# Patient Record
Sex: Female | Born: 1980 | Hispanic: No | Marital: Single | Smoking: Never smoker
Health system: Southern US, Community
[De-identification: ages and names within clinical notes are randomized; demographics above are authoritative.]

## PROBLEM LIST (undated history)

## (undated) DIAGNOSIS — Z6841 Body Mass Index (BMI) 40.0 and over, adult: Secondary | ICD-10-CM

## (undated) DIAGNOSIS — Z789 Other specified health status: Secondary | ICD-10-CM

## (undated) HISTORY — DX: Body Mass Index (BMI) 40.0 and over, adult: Z684

## (undated) HISTORY — DX: Other specified health status: Z78.9

---

## 2006-10-02 ENCOUNTER — Other Ambulatory Visit: Admission: RE | Admit: 2006-10-02 | Discharge: 2006-10-02 | Payer: Self-pay | Admitting: Gynecology

## 2021-10-07 LAB — OB RESULTS CONSOLE ANTIBODY SCREEN: Antibody Screen: NEGATIVE

## 2021-10-07 LAB — OB RESULTS CONSOLE GC/CHLAMYDIA
Chlamydia: POSITIVE
Gonorrhea: NEGATIVE

## 2021-10-07 LAB — HEPATITIS C ANTIBODY: HCV Ab: NEGATIVE

## 2021-10-07 LAB — OB RESULTS CONSOLE ABO/RH: RH Type: POSITIVE

## 2021-10-07 LAB — OB RESULTS CONSOLE RPR: RPR: NONREACTIVE

## 2021-10-07 LAB — OB RESULTS CONSOLE VARICELLA ZOSTER ANTIBODY, IGG: Varicella: IMMUNE

## 2021-10-07 LAB — OB RESULTS CONSOLE RUBELLA ANTIBODY, IGM: Rubella: IMMUNE

## 2021-10-07 LAB — OB RESULTS CONSOLE HIV ANTIBODY (ROUTINE TESTING): HIV: NONREACTIVE

## 2021-10-07 LAB — OB RESULTS CONSOLE HEPATITIS B SURFACE ANTIGEN: Hepatitis B Surface Ag: NEGATIVE

## 2021-10-12 ENCOUNTER — Other Ambulatory Visit: Payer: Self-pay | Admitting: Family Medicine

## 2021-10-12 DIAGNOSIS — Z363 Encounter for antenatal screening for malformations: Secondary | ICD-10-CM

## 2021-10-13 ENCOUNTER — Ambulatory Visit: Payer: Self-pay | Admitting: *Deleted

## 2021-10-13 ENCOUNTER — Ambulatory Visit: Payer: Self-pay | Attending: Family Medicine

## 2021-10-13 ENCOUNTER — Other Ambulatory Visit: Payer: Self-pay

## 2021-10-13 ENCOUNTER — Ambulatory Visit (HOSPITAL_BASED_OUTPATIENT_CLINIC_OR_DEPARTMENT_OTHER): Payer: Self-pay

## 2021-10-13 ENCOUNTER — Encounter: Payer: Self-pay | Admitting: *Deleted

## 2021-10-13 VITALS — BP 109/53 | HR 71 | Ht 60.0 in

## 2021-10-13 DIAGNOSIS — Z363 Encounter for antenatal screening for malformations: Secondary | ICD-10-CM | POA: Insufficient documentation

## 2021-10-13 DIAGNOSIS — O09522 Supervision of elderly multigravida, second trimester: Secondary | ICD-10-CM

## 2021-10-13 NOTE — Progress Notes (Signed)
Name: Dolores Frame Indication: Advanced Maternal Age  DOB: 1981-02-17 Age: 40 y.o.   EDC: 01/16/2022 LMP: 04/11/2021 Referring Provider:  Scot Jun, FNP  EGA: [redacted]w[redacted]d Genetic Counselor: Staci Righter, [place], [place]  OB HxRN:[id number] Date of Appointment: 10/13/2021  Accompanied by: Spanish Interpreter ID [id number] Face to Face Time: 30 Minutes   Previous Testing Completed: CBC from 10/07/2021 reviewed. MCV within normal limits. It is unlikely that Eslie is a beta thalassemia carrier or an alpha thalassemia carrier of the double-gene deletion. Individuals with a normal MCV may be single-gene deletion carriers, but it is unlikely that the current pregnancy would be affected with alpha or beta thalassemia major.   Medical History:  This is Everlie's 5th pregnancy. She has 3 living children. She has had one early loss. Reports she takes prenatal vitamins. Denies personal history of diabetes, high blood pressure, thyroid conditions, and seizures. Denies bleeding, infections, and fevers in this pregnancy. Denies using tobacco, alcohol, or street drugs in this pregnancy.   Family History: A pedigree was created and scanned into Epic under the Media tab. Camala reports one of her sons was born with a cleft lip without cleft palate. He is otherwise healthy and has no other birth defects.  Maternal ethnicity reported as Hispanic and paternal ethnicity reported as Hispanic. Denies Ashkenazi Jewish ancestry. Family history not remarkable for consanguinity, intellectual disability, autism spectrum disorder, multiple spontaneous abortions, still births, or unexplained neonatal death.     Genetic Counseling:   Advanced Maternal Age. With delivery at 10 years, Charniece's age-related risk to have a pregnancy with Down syndrome is 1/99 and risk to have a pregnancy with any chromosome condition is 1/56. Information about the most common chromosome conditions such as Down syndrome, Trisomy 81, Trisomy 75,  and common sex chromosome aneuploidies was described, as well as specifics about screening vs. diagnostic testing in pregnancy.  Previous Child with Cleft Lip. Cleft lip, with or without cleft palate, results when the lip and/or palate fails to close early in the first trimester of pregnancy. Majority of cleft lips also have cleft palate; however, due to the limitations of ultrasound examination, it is difficult to detect cleft palate prenatally. The vast majority of cleft lip, with or without cleft palate, are isolated occurrences within a family. Some cases may be part of a larger syndrome, either chromosomal or single-gene, or could be secondary to a maternal teratogen exposure such as a medication or infection. Taking into consideration that Shaquitta's son's cleft lip is apparently isolated, the recurrence risk for [facility] to have another child with a cleft lip, with or without cleft palate, is thought to be about 4%.  Birth Defects. All babies have approximately a 3-5% risk for a birth defect and a majority of these defects cannot be detected through the screening or diagnostic testing listed below. Ultrasound may detect some birth defects, but it may not detect all birth defects. About half of pregnancies with Down syndrome do not show any soft markers on ultrasound. A normal ultrasound does not guarantee a healthy pregnancy.   Testing/Screening Options:   Amniocentesis. This procedure is available for prenatal diagnosis. Possible procedural difficulties and complications that can arise include maternal infection, cramping, bleeding, fluid leakage, and/or pregnancy loss. The risk for pregnancy loss with an amniocentesis is 1/500. Per the [employer] of [employer]stetricians and Gynecologists (ACOG) Practice Bulletin 162, all pregnant women should be offered prenatal assessment for aneuploidy by diagnostic testing regardless of maternal age or other risk factors.  If indicated, genetic testing that could be  ordered on an amniocentesis sample includes a fetal karyotype, fetal microarray, and testing for specific syndromes.  Non-invasive prenatal screening (NIPS). This can screen the pregnancy for aneuploidy involving chromosomes 13, 18, 21, X, and Y. If NIPS results indicate high-risk for a chromosomal aneuploidy, prenatal diagnosis via CVS or amniocentesis would be recommended. A low-risk NIPS result does not ensure an unaffected pregnancy. NIPS does not screen for neural tube defects or other genetic conditions. Per the ACOG Practice Bulletin 226 all pregnant women regardless of age, baseline risk, and number of fetuses should be offered all screening options including NIPS which was previously only offered for singleton high risk pregnancies.  Carrier screening. Per the ACOG Committee Opinion 691, all women who are considering a pregnancy or are currently pregnant should be offered carrier screening for, at minimum, Cystic Fibrosis (CF), Spinal Muscular Atrophy (SMA), and Hemoglobinopathies. A negative result on carrier screening reduces the likelihood of being a carrier, however, does not entirely rule out the possibility. If Odester was found to be a carrier for a specific condition, carrier screening for their reproductive partner would be recommended.    Patient Plan:  Proceed with: NIPS, Carrier screening for CF, SMA, alpha thalassemia, and beta hemoglobinopathies currently pending at the time of her genetic counseling appointment Informed consent was obtained. All questions were answered.  Declined: Amniocentesis   Thank you for sharing in the care of [place] with [place].  Please do not hesitate to contact us if you have any questions.  Teena Dunk, MS, [facility]

## 2021-10-14 ENCOUNTER — Other Ambulatory Visit: Payer: Self-pay | Admitting: *Deleted

## 2021-10-14 DIAGNOSIS — O0932 Supervision of pregnancy with insufficient antenatal care, second trimester: Secondary | ICD-10-CM

## 2021-10-14 DIAGNOSIS — O99212 Obesity complicating pregnancy, second trimester: Secondary | ICD-10-CM

## 2021-10-14 DIAGNOSIS — O34219 Maternal care for unspecified type scar from previous cesarean delivery: Secondary | ICD-10-CM

## 2021-10-14 DIAGNOSIS — O09522 Supervision of elderly multigravida, second trimester: Secondary | ICD-10-CM

## 2021-10-18 ENCOUNTER — Telehealth: Payer: Self-pay | Admitting: Genetics

## 2021-10-18 NOTE — Telephone Encounter (Signed)
Called [facility] to return low risk NIPS results. Left voicemail with [facility] call back number.

## 2021-10-19 ENCOUNTER — Ambulatory Visit: Payer: Self-pay

## 2021-10-20 ENCOUNTER — Telehealth: Payer: Self-pay | Admitting: Genetics

## 2021-10-20 NOTE — Telephone Encounter (Signed)
Called [facility] to return NIPS and carrier screening results. Left voicemail with [facility] call back number.

## 2021-11-05 ENCOUNTER — Other Ambulatory Visit: Payer: Self-pay

## 2021-11-10 ENCOUNTER — Ambulatory Visit: Payer: Self-pay | Attending: Obstetrics

## 2021-11-10 ENCOUNTER — Other Ambulatory Visit: Payer: Self-pay

## 2021-11-10 ENCOUNTER — Other Ambulatory Visit: Payer: Self-pay | Admitting: *Deleted

## 2021-11-10 ENCOUNTER — Ambulatory Visit: Payer: Self-pay | Admitting: *Deleted

## 2021-11-10 VITALS — BP 99/46 | HR 71

## 2021-11-10 DIAGNOSIS — Z6841 Body Mass Index (BMI) 40.0 and over, adult: Secondary | ICD-10-CM

## 2021-11-10 DIAGNOSIS — O99212 Obesity complicating pregnancy, second trimester: Secondary | ICD-10-CM | POA: Insufficient documentation

## 2021-11-10 DIAGNOSIS — E669 Obesity, unspecified: Secondary | ICD-10-CM

## 2021-11-10 DIAGNOSIS — O09523 Supervision of elderly multigravida, third trimester: Secondary | ICD-10-CM

## 2021-11-10 DIAGNOSIS — O0932 Supervision of pregnancy with insufficient antenatal care, second trimester: Secondary | ICD-10-CM | POA: Insufficient documentation

## 2021-11-10 DIAGNOSIS — Z3A30 30 weeks gestation of pregnancy: Secondary | ICD-10-CM

## 2021-11-10 DIAGNOSIS — O34219 Maternal care for unspecified type scar from previous cesarean delivery: Secondary | ICD-10-CM | POA: Insufficient documentation

## 2021-11-10 DIAGNOSIS — O09522 Supervision of elderly multigravida, second trimester: Secondary | ICD-10-CM | POA: Insufficient documentation

## 2021-11-10 LAB — OB RESULTS CONSOLE GC/CHLAMYDIA: Chlamydia: NEGATIVE

## 2021-12-08 ENCOUNTER — Other Ambulatory Visit: Payer: Self-pay | Admitting: *Deleted

## 2021-12-08 ENCOUNTER — Encounter: Payer: Self-pay | Admitting: *Deleted

## 2021-12-08 ENCOUNTER — Other Ambulatory Visit: Payer: Self-pay

## 2021-12-08 ENCOUNTER — Ambulatory Visit: Payer: Self-pay | Admitting: *Deleted

## 2021-12-08 ENCOUNTER — Ambulatory Visit: Payer: Self-pay | Attending: Obstetrics

## 2021-12-08 VITALS — BP 106/59 | HR 70

## 2021-12-08 DIAGNOSIS — O09523 Supervision of elderly multigravida, third trimester: Secondary | ICD-10-CM

## 2021-12-08 DIAGNOSIS — O34219 Maternal care for unspecified type scar from previous cesarean delivery: Secondary | ICD-10-CM

## 2021-12-08 DIAGNOSIS — Z3A34 34 weeks gestation of pregnancy: Secondary | ICD-10-CM

## 2021-12-08 DIAGNOSIS — O409XX0 Polyhydramnios, unspecified trimester, not applicable or unspecified: Secondary | ICD-10-CM

## 2021-12-08 DIAGNOSIS — Z6841 Body Mass Index (BMI) 40.0 and over, adult: Secondary | ICD-10-CM | POA: Insufficient documentation

## 2021-12-08 DIAGNOSIS — O403XX0 Polyhydramnios, third trimester, not applicable or unspecified: Secondary | ICD-10-CM

## 2021-12-15 ENCOUNTER — Ambulatory Visit: Payer: Self-pay | Admitting: *Deleted

## 2021-12-15 ENCOUNTER — Encounter: Payer: Self-pay | Admitting: *Deleted

## 2021-12-15 ENCOUNTER — Other Ambulatory Visit: Payer: Self-pay

## 2021-12-15 ENCOUNTER — Ambulatory Visit: Payer: Self-pay | Attending: Obstetrics and Gynecology

## 2021-12-15 VITALS — BP 106/41 | HR 65

## 2021-12-15 DIAGNOSIS — O403XX0 Polyhydramnios, third trimester, not applicable or unspecified: Secondary | ICD-10-CM

## 2021-12-15 DIAGNOSIS — O09213 Supervision of pregnancy with history of pre-term labor, third trimester: Secondary | ICD-10-CM

## 2021-12-15 DIAGNOSIS — E669 Obesity, unspecified: Secondary | ICD-10-CM

## 2021-12-15 DIAGNOSIS — Z6841 Body Mass Index (BMI) 40.0 and over, adult: Secondary | ICD-10-CM | POA: Insufficient documentation

## 2021-12-15 DIAGNOSIS — Z3A35 35 weeks gestation of pregnancy: Secondary | ICD-10-CM

## 2021-12-15 DIAGNOSIS — O09523 Supervision of elderly multigravida, third trimester: Secondary | ICD-10-CM

## 2021-12-17 ENCOUNTER — Other Ambulatory Visit: Payer: Self-pay | Admitting: Family Medicine

## 2021-12-17 DIAGNOSIS — Z98891 History of uterine scar from previous surgery: Secondary | ICD-10-CM

## 2021-12-22 ENCOUNTER — Ambulatory Visit: Payer: Self-pay | Attending: Obstetrics and Gynecology

## 2021-12-22 ENCOUNTER — Other Ambulatory Visit: Payer: Self-pay | Admitting: *Deleted

## 2021-12-22 ENCOUNTER — Ambulatory Visit: Payer: Self-pay | Admitting: *Deleted

## 2021-12-22 ENCOUNTER — Other Ambulatory Visit: Payer: Self-pay

## 2021-12-22 VITALS — BP 96/48 | HR 71

## 2021-12-22 DIAGNOSIS — Z6841 Body Mass Index (BMI) 40.0 and over, adult: Secondary | ICD-10-CM

## 2021-12-22 DIAGNOSIS — O09523 Supervision of elderly multigravida, third trimester: Secondary | ICD-10-CM

## 2021-12-22 DIAGNOSIS — O34219 Maternal care for unspecified type scar from previous cesarean delivery: Secondary | ICD-10-CM

## 2021-12-22 DIAGNOSIS — Z3A36 36 weeks gestation of pregnancy: Secondary | ICD-10-CM

## 2021-12-22 DIAGNOSIS — O99213 Obesity complicating pregnancy, third trimester: Secondary | ICD-10-CM

## 2021-12-22 DIAGNOSIS — E669 Obesity, unspecified: Secondary | ICD-10-CM

## 2021-12-22 LAB — OB RESULTS CONSOLE GBS: GBS: NEGATIVE

## 2021-12-22 LAB — OB RESULTS CONSOLE GC/CHLAMYDIA: Gonorrhea: NEGATIVE

## 2021-12-28 NOTE — Patient Instructions (Addendum)
[facility] ? 12/28/2021 ? ? Your procedure is scheduled on:  01/11/2022 ? Arrive at [employer] at [employer] on [address] at [facility]  and [address]. You are invited to use the FREE valet parking or use the Visitor's parking deck. ? Pick up the phone at the desk and dial [phone]. ? Call this number if you have problems the morning of surgery: [phone] ? Remember: ? ? Do not eat food:(After Midnight) Desp?s de medianoche. ? Do not drink clear liquids: (After Midnight) Desp?s de medianoche. ? Take these medicines the morning of surgery with A SIP OF WATER:  none ? ? Do not wear jewelry, make-up or nail polish. ? Do not wear lotions, powders, or perfumes. Do not wear deodorant. ? Do not shave 48 hours prior to surgery. ? Do not bring valuables to the hospital.  [facility] is not  ? responsible for any belongings or valuables brought to the hospital. ? Contacts, dentures or bridgework may not be worn into surgery. ? Leave suitcase in the car. After surgery it may be brought to your room. ? For patients admitted to the hospital, checkout time is 11:00 AM the day of  ?            discharge. ? ?   ? Please read over the following fact sheets that you were given:  ?   Preparing for Surgery ? ?                                        Instrucciones Para Antes de la Cirug?a ?  ?Su cirug?a est? programada para 01/11/2022  (your procedure is scheduled on) Entre por la entrada principal del [facility]  a las 0645 de la ma?ana -(enter through the main entrance at [facility] at 0645 AM  ?  ?[address] tel?fono, [place] [phone] para informarnos de su llegada. (pick up phone, dial [phone] on arrival)   ?  ?Por favor llame al [phone] si tiene alg?n problema en la ma?ana de la cirug?a (please call this number if you have any problems the morning of surgery.) ?                 ?Recuerde: (Remember)  ?No coma alimentos. ?(Do not eat food (After Midnight) Desp?s de medianoche)  ?  ?No  tome l?quidos claros. ?(Do not drink clear liquids (After Midnight) Desp?s de medianoche)  ?  ?No use joyas, maquillaje de ojos, l?piz labial, crema para el cuerpo o esmalte de u?as oscuro. (Do not wear jewelry, eye makeup, lipstick, body lotion, or dark fingernail polish). Puede usar desodorante (you may wear deodorant)  ?  ?No se afeite 48 horas de su cirug?a. ?(Do not shave 48 hours before your surgery)  ?  ?No traiga objetos de valor al hospital.  [facility] no se hace responsable de ninguna pertenencia, ni objetos de valor que haya tra?do al hospital. ?(Do not bring valuable to the hospital.  [facility] is not responsible for any belongings or valuables brought to the hospital) ?  ?Tome estas medicinas en la ma?ana de la cirug?a con un SORBITO de agua nada (take these meds the morning of surgery with a SIP of water)   ?  ?Durante la cirug?a no se pueden usar lentes de contacto, dentaduras o puentes. ?(Contacts, dentures or bridgework cannot be worn in surgery). ?  ?  Si va a ser ingresado despu?s de la cirug?a, deje la maleta en el carro hasta que se le haya asignado una habitaci?n. (If you are to be admitted after surgery, leave suitcase in car until your room has been assigned.) ?  ?A los pacientes que se les d? de alta el mismo d?a no se les permitir? manejar a casa.  ?(Patients discharged on the day of surgery will not be allowed to drive home) ?  ? ?Nombre y n?mero de tel?fono del Engineer, production. ?(Name and telephone number of your driver) ?  ?Instrucciones especiales ?Shower using CHG 2 nights before surgery and the night before surgery.  If you shower the day of surgery use CHG.  Use special wash - you have one bottle of CHG for all showers.  You should use approximately 1/3 of the bottle for each shower. ?(Special Instructions) ?  ?Por favor, lea las hojas informativas que le entregaron. ?(Please read over the following fact sheets that you were given) ?Surgical Site Infection Prevention ? ?

## 2021-12-30 ENCOUNTER — Encounter: Payer: Self-pay | Admitting: *Deleted

## 2021-12-30 ENCOUNTER — Ambulatory Visit: Payer: Self-pay | Attending: Obstetrics and Gynecology

## 2021-12-30 ENCOUNTER — Other Ambulatory Visit: Payer: Self-pay

## 2021-12-30 ENCOUNTER — Ambulatory Visit: Payer: Self-pay | Admitting: *Deleted

## 2021-12-30 VITALS — BP 106/59 | HR 78

## 2021-12-30 DIAGNOSIS — Z6841 Body Mass Index (BMI) 40.0 and over, adult: Secondary | ICD-10-CM | POA: Insufficient documentation

## 2021-12-30 DIAGNOSIS — O403XX0 Polyhydramnios, third trimester, not applicable or unspecified: Secondary | ICD-10-CM

## 2021-12-30 DIAGNOSIS — O34219 Maternal care for unspecified type scar from previous cesarean delivery: Secondary | ICD-10-CM

## 2021-12-30 DIAGNOSIS — O0933 Supervision of pregnancy with insufficient antenatal care, third trimester: Secondary | ICD-10-CM

## 2021-12-30 DIAGNOSIS — O409XX0 Polyhydramnios, unspecified trimester, not applicable or unspecified: Secondary | ICD-10-CM | POA: Insufficient documentation

## 2021-12-30 DIAGNOSIS — O09523 Supervision of elderly multigravida, third trimester: Secondary | ICD-10-CM

## 2021-12-30 DIAGNOSIS — Z3A37 37 weeks gestation of pregnancy: Secondary | ICD-10-CM

## 2021-12-31 NOTE — Pre-Procedure Instructions (Signed)
Preadmission screen ?Interpreter number [phone] ?

## 2021-12-31 NOTE — Pre-Procedure Instructions (Signed)
Message left

## 2022-01-03 ENCOUNTER — Telehealth (HOSPITAL_COMMUNITY): Payer: Self-pay | Admitting: *Deleted

## 2022-01-03 NOTE — Telephone Encounter (Signed)
Preadmission screen  

## 2022-01-04 NOTE — Pre-Procedure Instructions (Addendum)
Interpreter number [phone] ?Message left ?

## 2022-01-05 ENCOUNTER — Encounter: Payer: Self-pay | Admitting: *Deleted

## 2022-01-05 ENCOUNTER — Ambulatory Visit: Payer: Medicaid Other | Attending: Obstetrics and Gynecology

## 2022-01-05 ENCOUNTER — Ambulatory Visit: Payer: Self-pay | Admitting: *Deleted

## 2022-01-05 ENCOUNTER — Other Ambulatory Visit: Payer: Self-pay

## 2022-01-05 VITALS — BP 108/62 | HR 80

## 2022-01-05 DIAGNOSIS — Z6841 Body Mass Index (BMI) 40.0 and over, adult: Secondary | ICD-10-CM | POA: Insufficient documentation

## 2022-01-05 DIAGNOSIS — O99213 Obesity complicating pregnancy, third trimester: Secondary | ICD-10-CM | POA: Diagnosis not present

## 2022-01-05 DIAGNOSIS — O0933 Supervision of pregnancy with insufficient antenatal care, third trimester: Secondary | ICD-10-CM

## 2022-01-05 DIAGNOSIS — O34219 Maternal care for unspecified type scar from previous cesarean delivery: Secondary | ICD-10-CM | POA: Diagnosis not present

## 2022-01-05 DIAGNOSIS — Z3A38 38 weeks gestation of pregnancy: Secondary | ICD-10-CM

## 2022-01-05 DIAGNOSIS — O09523 Supervision of elderly multigravida, third trimester: Secondary | ICD-10-CM | POA: Insufficient documentation

## 2022-01-05 DIAGNOSIS — O403XX0 Polyhydramnios, third trimester, not applicable or unspecified: Secondary | ICD-10-CM

## 2022-01-05 DIAGNOSIS — O409XX0 Polyhydramnios, unspecified trimester, not applicable or unspecified: Secondary | ICD-10-CM

## 2022-01-05 DIAGNOSIS — Z8279 Family history of other congenital malformations, deformations and chromosomal abnormalities: Secondary | ICD-10-CM

## 2022-01-06 ENCOUNTER — Telehealth (HOSPITAL_COMMUNITY): Payer: Self-pay | Admitting: *Deleted

## 2022-01-06 ENCOUNTER — Encounter (HOSPITAL_COMMUNITY): Payer: Self-pay

## 2022-01-06 NOTE — Telephone Encounter (Signed)
Preadmission screen ?Interpreter number [phone] ?

## 2022-01-10 ENCOUNTER — Other Ambulatory Visit: Payer: Self-pay

## 2022-01-10 ENCOUNTER — Other Ambulatory Visit (HOSPITAL_COMMUNITY)
Admission: RE | Admit: 2022-01-10 | Discharge: 2022-01-10 | Disposition: A | Discharge status: Home / Self Care | Payer: Self-pay | Source: Ambulatory Visit | Attending: Obstetrics and Gynecology | Admitting: Obstetrics and Gynecology

## 2022-01-10 DIAGNOSIS — Z98891 History of uterine scar from previous surgery: Secondary | ICD-10-CM

## 2022-01-10 DIAGNOSIS — Z01812 Encounter for preprocedural laboratory examination: Secondary | ICD-10-CM | POA: Insufficient documentation

## 2022-01-10 LAB — CBC
HCT: 35.1 % — ABNORMAL LOW (ref 36.0–46.0)
Hemoglobin: 11.7 g/dL — ABNORMAL LOW (ref 12.0–15.0)
MCH: 30.4 pg (ref 26.0–34.0)
MCHC: 33.3 g/dL (ref 30.0–36.0)
MCV: 91.2 fL (ref 80.0–100.0)
Platelets: 163 10*3/uL (ref 150–400)
RBC: 3.85 MIL/uL — ABNORMAL LOW (ref 3.87–5.11)
RDW: 14.1 % (ref 11.5–15.5)
WBC: 9.3 10*3/uL (ref 4.0–10.5)
nRBC: 0 % (ref 0.0–0.2)

## 2022-01-10 LAB — TYPE AND SCREEN
ABO/RH(D): O POS
Antibody Screen: NEGATIVE

## 2022-01-10 LAB — RAPID HIV SCREEN (HIV 1/2 AB+AG)
HIV 1/2 Antibodies: NONREACTIVE
HIV-1 P24 Antigen - HIV24: NONREACTIVE

## 2022-01-11 ENCOUNTER — Inpatient Hospital Stay (HOSPITAL_COMMUNITY)
Admission: RE | Admit: 2022-01-11 | Discharge: 2022-01-13 | DRG: 785 | Disposition: A | Discharge status: Home / Self Care | Payer: Medicaid Other | Attending: Obstetrics and Gynecology | Admitting: Obstetrics and Gynecology

## 2022-01-11 ENCOUNTER — Other Ambulatory Visit: Payer: Self-pay

## 2022-01-11 ENCOUNTER — Inpatient Hospital Stay (HOSPITAL_COMMUNITY): Payer: Medicaid Other | Admitting: Anesthesiology

## 2022-01-11 ENCOUNTER — Encounter (HOSPITAL_COMMUNITY)
Admission: RE | Disposition: A | Discharge status: Home / Self Care | Payer: Self-pay | Source: Home / Self Care | Attending: Obstetrics and Gynecology

## 2022-01-11 ENCOUNTER — Encounter (HOSPITAL_COMMUNITY): Payer: Self-pay | Admitting: Obstetrics and Gynecology

## 2022-01-11 DIAGNOSIS — Z3A39 39 weeks gestation of pregnancy: Secondary | ICD-10-CM

## 2022-01-11 DIAGNOSIS — Z789 Other specified health status: Secondary | ICD-10-CM | POA: Diagnosis present

## 2022-01-11 DIAGNOSIS — Z302 Encounter for sterilization: Secondary | ICD-10-CM | POA: Diagnosis not present

## 2022-01-11 DIAGNOSIS — O99214 Obesity complicating childbirth: Secondary | ICD-10-CM | POA: Diagnosis present

## 2022-01-11 DIAGNOSIS — Z6841 Body Mass Index (BMI) 40.0 and over, adult: Secondary | ICD-10-CM

## 2022-01-11 DIAGNOSIS — O34219 Maternal care for unspecified type scar from previous cesarean delivery: Secondary | ICD-10-CM | POA: Diagnosis not present

## 2022-01-11 DIAGNOSIS — Z20822 Contact with and (suspected) exposure to covid-19: Secondary | ICD-10-CM | POA: Diagnosis present

## 2022-01-11 DIAGNOSIS — O09523 Supervision of elderly multigravida, third trimester: Secondary | ICD-10-CM | POA: Diagnosis present

## 2022-01-11 DIAGNOSIS — O98811 Other maternal infectious and parasitic diseases complicating pregnancy, first trimester: Secondary | ICD-10-CM | POA: Diagnosis not present

## 2022-01-11 DIAGNOSIS — O99210 Obesity complicating pregnancy, unspecified trimester: Secondary | ICD-10-CM | POA: Diagnosis present

## 2022-01-11 DIAGNOSIS — O34211 Maternal care for low transverse scar from previous cesarean delivery: Principal | ICD-10-CM | POA: Diagnosis present

## 2022-01-11 DIAGNOSIS — Z98891 History of uterine scar from previous surgery: Secondary | ICD-10-CM

## 2022-01-11 DIAGNOSIS — A749 Chlamydial infection, unspecified: Secondary | ICD-10-CM | POA: Diagnosis not present

## 2022-01-11 HISTORY — PX: TUBAL LIGATION: SHX77

## 2022-01-11 LAB — RESP PANEL BY RT-PCR (FLU A&B, COVID) ARPGX2
Influenza A by PCR: NEGATIVE
Influenza B by PCR: NEGATIVE
SARS Coronavirus 2 by RT PCR: NEGATIVE

## 2022-01-11 LAB — RPR: RPR Ser Ql: NONREACTIVE

## 2022-01-11 SURGERY — Surgical Case
Anesthesia: Spinal

## 2022-01-11 MED ORDER — PHENYLEPHRINE HCL-NACL 20-0.9 MG/250ML-% IV SOLN
INTRAVENOUS | Status: DC | PRN
  Administered 2022-01-11: 60 ug/min via INTRAVENOUS

## 2022-01-11 MED ORDER — POVIDONE-IODINE 10 % EX SWAB
2.0000 "application " | Freq: Once | CUTANEOUS | Status: AC
  Administered 2022-01-11: 2 via TOPICAL

## 2022-01-11 MED ORDER — SIMETHICONE 80 MG PO CHEW: 80.0000 mg | CHEWABLE_TABLET | ORAL | Status: DC | PRN

## 2022-01-11 MED ORDER — ONDANSETRON HCL 4 MG/2ML IJ SOLN
INTRAMUSCULAR | Status: AC
  Filled 2022-01-11: qty 2

## 2022-01-11 MED ORDER — ENOXAPARIN SODIUM 60 MG/0.6ML IJ SOSY
50.0000 mg | PREFILLED_SYRINGE | INTRAMUSCULAR | Status: DC
  Administered 2022-01-12 – 2022-01-13 (×2): 50 mg via SUBCUTANEOUS
  Filled 2022-01-11 (×2): qty 0.6

## 2022-01-11 MED ORDER — ONDANSETRON HCL 4 MG PO TABS
4.0000 mg | ORAL_TABLET | Freq: Three times a day (TID) | ORAL | Status: DC | PRN
  Administered 2022-01-11: 4 mg via ORAL
  Filled 2022-01-11: qty 1

## 2022-01-11 MED ORDER — HYDROMORPHONE HCL 1 MG/ML IJ SOLN: 0.2500 mg | INTRAMUSCULAR | Status: DC | PRN

## 2022-01-11 MED ORDER — COCONUT OIL OIL: 1.0000 "application " | TOPICAL_OIL | Status: DC | PRN

## 2022-01-11 MED ORDER — OXYTOCIN-SODIUM CHLORIDE 30-0.9 UT/500ML-% IV SOLN
INTRAVENOUS | Status: DC | PRN
  Administered 2022-01-11: 200 mL via INTRAVENOUS

## 2022-01-11 MED ORDER — ACETAMINOPHEN 325 MG PO TABS: 650.0000 mg | ORAL_TABLET | Freq: Four times a day (QID) | ORAL | Status: DC | PRN

## 2022-01-11 MED ORDER — DIPHENHYDRAMINE HCL 25 MG PO CAPS: 25.0000 mg | ORAL_CAPSULE | Freq: Four times a day (QID) | ORAL | Status: DC | PRN

## 2022-01-11 MED ORDER — LACTATED RINGERS IV SOLN
125.0000 mL/h | INTRAVENOUS | Status: DC
  Administered 2022-01-11 (×2): 125 mL/h via INTRAVENOUS

## 2022-01-11 MED ORDER — FENTANYL CITRATE (PF) 100 MCG/2ML IJ SOLN
INTRAMUSCULAR | Status: AC
  Filled 2022-01-11: qty 2

## 2022-01-11 MED ORDER — DIBUCAINE (PERIANAL) 1 % EX OINT: 1.0000 "application " | TOPICAL_OINTMENT | CUTANEOUS | Status: DC | PRN

## 2022-01-11 MED ORDER — CEFAZOLIN SODIUM-DEXTROSE 2-4 GM/100ML-% IV SOLN
2.0000 g | INTRAVENOUS | Status: AC
  Administered 2022-01-11: 2 g via INTRAVENOUS

## 2022-01-11 MED ORDER — ONDANSETRON HCL 4 MG PO TABS: 4.0000 mg | ORAL_TABLET | Freq: Once | ORAL | Status: DC | PRN

## 2022-01-11 MED ORDER — NALOXONE HCL 0.4 MG/ML IJ SOLN: 0.4000 mg | INTRAMUSCULAR | Status: DC | PRN

## 2022-01-11 MED ORDER — SENNOSIDES-DOCUSATE SODIUM 8.6-50 MG PO TABS
2.0000 | ORAL_TABLET | Freq: Every day | ORAL | Status: DC
  Administered 2022-01-12 – 2022-01-13 (×2): 2 via ORAL
  Filled 2022-01-11 (×2): qty 2

## 2022-01-11 MED ORDER — SODIUM CHLORIDE 0.9% FLUSH: 3.0000 mL | INTRAVENOUS | Status: DC | PRN

## 2022-01-11 MED ORDER — DIPHENHYDRAMINE HCL 50 MG/ML IJ SOLN: 12.5000 mg | INTRAMUSCULAR | Status: DC | PRN

## 2022-01-11 MED ORDER — KETOROLAC TROMETHAMINE 30 MG/ML IJ SOLN
30.0000 mg | Freq: Once | INTRAMUSCULAR | Status: AC | PRN
  Administered 2022-01-11: 30 mg via INTRAVENOUS

## 2022-01-11 MED ORDER — CEFAZOLIN SODIUM-DEXTROSE 2-4 GM/100ML-% IV SOLN
INTRAVENOUS | Status: AC
  Filled 2022-01-11: qty 100

## 2022-01-11 MED ORDER — NALOXONE HCL 4 MG/10ML IJ SOLN
1.0000 ug/kg/h | INTRAVENOUS | Status: DC | PRN
  Filled 2022-01-11: qty 5

## 2022-01-11 MED ORDER — ACETAMINOPHEN 325 MG PO TABS
650.0000 mg | ORAL_TABLET | Freq: Four times a day (QID) | ORAL | Status: DC | PRN
  Administered 2022-01-11 – 2022-01-13 (×4): 650 mg via ORAL
  Filled 2022-01-11 (×4): qty 2

## 2022-01-11 MED ORDER — FENTANYL CITRATE (PF) 250 MCG/5ML IJ SOLN
INTRAMUSCULAR | Status: DC | PRN
  Administered 2022-01-11: 15 ug via INTRATHECAL

## 2022-01-11 MED ORDER — PRENATAL MULTIVITAMIN CH
1.0000 | ORAL_TABLET | Freq: Every day | ORAL | Status: DC
  Administered 2022-01-11 – 2022-01-13 (×3): 1 via ORAL
  Filled 2022-01-11 (×3): qty 1

## 2022-01-11 MED ORDER — PROMETHAZINE HCL 25 MG/ML IJ SOLN: 6.2500 mg | INTRAMUSCULAR | Status: DC | PRN

## 2022-01-11 MED ORDER — OXYCODONE HCL 5 MG/5ML PO SOLN: 5.0000 mg | Freq: Once | ORAL | Status: DC | PRN

## 2022-01-11 MED ORDER — DIPHENHYDRAMINE HCL 25 MG PO CAPS: 25.0000 mg | ORAL_CAPSULE | ORAL | Status: DC | PRN

## 2022-01-11 MED ORDER — SODIUM CHLORIDE 0.9 % IR SOLN
Status: DC | PRN
  Administered 2022-01-11: 1000 mL

## 2022-01-11 MED ORDER — GABAPENTIN 100 MG PO CAPS
100.0000 mg | ORAL_CAPSULE | Freq: Two times a day (BID) | ORAL | Status: DC
  Administered 2022-01-11 – 2022-01-13 (×5): 100 mg via ORAL
  Filled 2022-01-11 (×5): qty 1

## 2022-01-11 MED ORDER — MORPHINE SULFATE (PF) 0.5 MG/ML IJ SOLN
INTRAMUSCULAR | Status: DC | PRN
  Administered 2022-01-11: 150 ug via INTRATHECAL

## 2022-01-11 MED ORDER — BUPIVACAINE HCL (PF) 0.75 % IJ SOLN
INTRAMUSCULAR | Status: DC | PRN
  Administered 2022-01-11: 1.6 mL via INTRATHECAL

## 2022-01-11 MED ORDER — KETOROLAC TROMETHAMINE 30 MG/ML IJ SOLN
INTRAMUSCULAR | Status: AC
  Filled 2022-01-11: qty 1

## 2022-01-11 MED ORDER — STERILE WATER FOR IRRIGATION IR SOLN
Status: DC | PRN
  Administered 2022-01-11: 1000 mL

## 2022-01-11 MED ORDER — DEXAMETHASONE SODIUM PHOSPHATE 10 MG/ML IJ SOLN
INTRAMUSCULAR | Status: AC
  Filled 2022-01-11: qty 1

## 2022-01-11 MED ORDER — WITCH HAZEL-GLYCERIN EX PADS: 1.0000 "application " | MEDICATED_PAD | CUTANEOUS | Status: DC | PRN

## 2022-01-11 MED ORDER — OXYCODONE HCL 5 MG PO TABS: 5.0000 mg | ORAL_TABLET | Freq: Once | ORAL | Status: DC | PRN

## 2022-01-11 MED ORDER — MEPERIDINE HCL 25 MG/ML IJ SOLN: 6.2500 mg | INTRAMUSCULAR | Status: DC | PRN

## 2022-01-11 MED ORDER — OXYCODONE HCL 5 MG PO TABS
5.0000 mg | ORAL_TABLET | ORAL | Status: DC | PRN
  Administered 2022-01-12: 10 mg via ORAL
  Administered 2022-01-13 (×2): 5 mg via ORAL
  Filled 2022-01-11: qty 2
  Filled 2022-01-11 (×2): qty 1

## 2022-01-11 MED ORDER — DEXAMETHASONE SODIUM PHOSPHATE 10 MG/ML IJ SOLN
INTRAMUSCULAR | Status: DC | PRN
  Administered 2022-01-11: 5 mg via INTRAVENOUS

## 2022-01-11 MED ORDER — ONDANSETRON HCL 4 MG/2ML IJ SOLN
INTRAMUSCULAR | Status: DC | PRN
  Administered 2022-01-11: 4 mg via INTRAVENOUS

## 2022-01-11 MED ORDER — TETANUS-DIPHTH-ACELL PERTUSSIS 5-2.5-18.5 LF-MCG/0.5 IM SUSY
0.5000 mL | PREFILLED_SYRINGE | Freq: Once | INTRAMUSCULAR | Status: DC
Start: 2022-01-12 — End: 2022-01-13

## 2022-01-11 MED ORDER — IBUPROFEN 600 MG PO TABS
600.0000 mg | ORAL_TABLET | Freq: Four times a day (QID) | ORAL | Status: DC
  Administered 2022-01-11 – 2022-01-13 (×8): 600 mg via ORAL
  Filled 2022-01-11 (×8): qty 1

## 2022-01-11 MED ORDER — MENTHOL 3 MG MT LOZG: 1.0000 | LOZENGE | OROMUCOSAL | Status: DC | PRN

## 2022-01-11 MED ORDER — SIMETHICONE 80 MG PO CHEW
80.0000 mg | CHEWABLE_TABLET | Freq: Three times a day (TID) | ORAL | Status: DC
  Administered 2022-01-11 – 2022-01-13 (×4): 80 mg via ORAL
  Filled 2022-01-11 (×4): qty 1

## 2022-01-11 MED ORDER — ACETAMINOPHEN 10 MG/ML IV SOLN
INTRAVENOUS | Status: AC
  Filled 2022-01-11: qty 100

## 2022-01-11 MED ORDER — ACETAMINOPHEN 10 MG/ML IV SOLN
INTRAVENOUS | Status: DC | PRN
  Administered 2022-01-11: 1000 mg via INTRAVENOUS

## 2022-01-11 MED ORDER — OXYTOCIN-SODIUM CHLORIDE 30-0.9 UT/500ML-% IV SOLN: 2.5000 [IU]/h | INTRAVENOUS | Status: AC

## 2022-01-11 MED ORDER — MORPHINE SULFATE (PF) 0.5 MG/ML IJ SOLN
INTRAMUSCULAR | Status: AC
  Filled 2022-01-11: qty 10

## 2022-01-11 SURGICAL SUPPLY — 34 items
APL SKNCLS STERI-STRIP NONHPOA (GAUZE/BANDAGES/DRESSINGS) ×1
BENZOIN TINCTURE PRP APPL 2/3 (GAUZE/BANDAGES/DRESSINGS) ×3 IMPLANT
CANISTER SUCT 3000ML PPV (MISCELLANEOUS) ×3 IMPLANT
CHLORAPREP W/TINT 26ML (MISCELLANEOUS) ×4 IMPLANT
DRSG OPSITE POSTOP 4X10 (GAUZE/BANDAGES/DRESSINGS) ×3 IMPLANT
ELECT REM PT RETURN 9FT ADLT (ELECTROSURGICAL) ×2
ELECTRODE REM PT RTRN 9FT ADLT (ELECTROSURGICAL) ×2 IMPLANT
EXTRACTOR VACUUM KIWI (MISCELLANEOUS) ×3 IMPLANT
GLOVE BIOGEL PI IND STRL 7.0 (GLOVE) ×4 IMPLANT
GLOVE BIOGEL PI IND STRL 7.5 (GLOVE) ×2 IMPLANT
GLOVE BIOGEL PI INDICATOR 7.0 (GLOVE) ×2
GLOVE BIOGEL PI INDICATOR 7.5 (GLOVE) ×1
GLOVE SKINSENSE NS SZ7.0 (GLOVE) ×1
GLOVE SKINSENSE STRL SZ7.0 (GLOVE) ×2 IMPLANT
GOWN STRL REUS W/ TWL LRG LVL3 (GOWN DISPOSABLE) ×4 IMPLANT
GOWN STRL REUS W/ TWL XL LVL3 (GOWN DISPOSABLE) ×2 IMPLANT
GOWN STRL REUS W/TWL LRG LVL3 (GOWN DISPOSABLE) ×4
GOWN STRL REUS W/TWL XL LVL3 (GOWN DISPOSABLE) ×2
NS IRRIG 1000ML POUR BTL (IV SOLUTION) ×3 IMPLANT
PACK C SECTION WH (CUSTOM PROCEDURE TRAY) ×3 IMPLANT
PAD ABD 7.5X8 STRL (GAUZE/BANDAGES/DRESSINGS) ×3 IMPLANT
PAD OB MATERNITY 4.3X12.25 (PERSONAL CARE ITEMS) ×3 IMPLANT
PAD PREP 24X48 CUFFED NSTRL (MISCELLANEOUS) ×3 IMPLANT
STRIP CLOSURE SKIN 1/2X4 (GAUZE/BANDAGES/DRESSINGS) ×3 IMPLANT
SUT MNCRL 0 VIOLET CTX 36 (SUTURE) ×4 IMPLANT
SUT MON AB 4-0 PS1 27 (SUTURE) ×3 IMPLANT
SUT MONOCRYL 0 CTX 36 (SUTURE) ×4
SUT PLAIN 2 0 XLH (SUTURE) ×3 IMPLANT
SUT VIC AB 0 CT1 36 (SUTURE) ×6 IMPLANT
SUT VIC AB 2-0 CT1 (SUTURE) ×2 IMPLANT
SUT VIC AB 3-0 CT1 27 (SUTURE) ×2
SUT VIC AB 3-0 CT1 TAPERPNT 27 (SUTURE) ×2 IMPLANT
TOWEL OR 17X24 6PK STRL BLUE (TOWEL DISPOSABLE) ×6 IMPLANT
WATER STERILE IRR 1000ML POUR (IV SOLUTION) ×3 IMPLANT

## 2022-01-11 NOTE — Op Note (Signed)
Operative Note  ? ?SURGERY DATE: 01/11/2022 ? ?PRE-OP DIAGNOSIS:  ?*Pregnancy at 39/2 ?*History of cesarean section x 3 ?*Advanced maternal age ?*BMI 40s ? ?POST-OP DIAGNOSIS: Same. Delivered  ? ?PROCEDURE: Repeat low transverse cesarean section via pfannenstiel skin incision with double layer uterine closure and bilateral salpingectomy ? ?SURGEON: Surgeon(s) and Role: ?   * Aletha Halim, MD - Primary ? ?ASSISTANT: Stinson, DO (2nd) ? ?ANESTHESIA: spinal ? ?ESTIMATED BLOOD LOSS:  415mL ? ?DRAINS: 195mL UOP via indwelling foley ? ?TOTAL IV FLUIDS: per anesthesia note ? ?VTE PROPHYLAXIS: SCDs to bilateral lower extremities ? ?ANTIBIOTICS: Two grams of Cefazolin were given., within 1 hour of skin incision ? ?SPECIMENS: None ? ?COMPLICATIONS: None ? ?FINDINGS: Of note, there were noo intra-abdominal adhesions; the fascia above was a little scarred and tough. Grossly normal uterus, tubes and ovaries. Clear amniotic fluid, cephalic, female infant, weight 3440gm, APGARs 8/9, intact placenta. ? ?PROCEDURE IN DETAIL: The patient was taken to the operating room where anesthesia was administered and normal fetal heart tones were confirmed. She was then prepped and draped in the normal fashion in the dorsal supine position with a leftward tilt.  After a time out was performed, a pfannensteil skin incision was made with the scalpel and carried through to the underlying layer of fascia. The fascia was then incised at the midline and this incision was extended laterally with the mayo scissors. Attention was turned to the superior aspect of the fascial incision which was grasped with the kocher clamps x 2, tented up and the rectus muscles were dissected off with the scalpel. In a similar fashion the inferior aspect of the fascial incision was grasped with the kocher clamps, tented up and the rectus muscles dissected off with the mayo scissors. The rectus muscles were then separated in the midline and the peritoneum was entered  bluntly. The bladder blade was inserted and the vesicouterine peritoneum was identified, tented up and entered with the metzenbaum scissors. This incision was extended laterally and the bladder flap was created digitally. The bladder blade was reinserted. ? ?A low transverse hysterotomy was made with the scalpel until the endometrial cavity was breached and the amniotic sac ruptured with the Allis clamp, yielding clear amniotic fluid. This incision was extended bluntly and the infant?s head, shoulders and body were delivered atraumatically.The cord was clamped x 2 and cut, and the infant was handed to the awaiting pediatricians, after delayed cord clamping was done.  The placenta was then gradually expressed from the uterus and then the uterus was exteriorized and cleared of all clots and debris. The hysterotomy was repaired with a running suture of 1-0 monocryl. A second imbricating layer of 1-0 monocryl suture was then placed to achieve excellent hemostasis.  ? ?Tubes were traced to their respective fimbriae and a Kelly clamp placed across the mesosalpinx to the cornual area. This was double clamped and the tube cut and removed. A 1-0 vicryl was used to free tie under the first clamp and then the clamp was removed and the 2nd clamp had a fore and aft stitch placed with another 1-0 vicryl and then the clamp was removed; excellent hemostasis noted from both sides ? ?The uterus and adnexa were then returned to the abdomen, and the hysterotomy and all operative sites were reinspected and excellent hemostasis was noted after irrigation and suction of the abdomen with warm saline. ? ?The fascia was reapproximated with 0 Vicryl in a simple running fashion bilaterally. The subcutaneous layer was then reapproximated  with interrupted sutures of 2-0 plain gut, and the skin was then closed with 4-0 monocryl, in a subcuticular fashion. ? ?The patient  tolerated the procedure well. Sponge, lap, needle, and instrument counts  were correct x 2. The patient was transferred to the recovery room awake, alert and breathing independently in stable condition. ? ?Durene Romans MD ?Attending ?[facility] [employer] [facility] farm manager) ? ?

## 2022-01-11 NOTE — Progress Notes (Signed)
Spoke to Dr. Maurene Capes and received order to pull epidural from patient. Epidural removed by this RN, catheter visualized and site WNL. ?

## 2022-01-11 NOTE — H&P (Signed)
Obstetrics Admission History & Physical  ?01/11/2022 - 8:17 AM ?Primary OBGYN: [facility] ? ?Chief Complaint: scheduled repeat c-section  ?History of Present Illness  ?41 y.o. [id number] @ [redacted]w[redacted]d, with the above CC. Pregnancy complicated by: h/o c-section x 3, AMA, BMI 35s, h/o child with a cleft lip, h/o chlamydia with neg toc ? ?Ms. [facility] states that she has no OB concerns this morning ? ?Review of Systems: as noted in the History of Present Illness. ? ?Patient Active Problem List  ? Diagnosis Date Noted  ? AMA (advanced maternal age) multigravida 30+, third trimester 01/11/2022  ? Language barrier 01/11/2022  ? BMI 40.0-44.9, adult ([place]) 01/11/2022  ? Obesity in pregnancy 01/11/2022  ? History of cesarean delivery 01/11/2022  ? Chlamydia infection affecting pregnancy in first trimester 01/11/2022  ?  ? ?PMHx:  ?Past Medical History:  ?Diagnosis Date  ? BMI 40.0-44.9, adult ([place])   ? Medical history non-contributory   ? ?PSHx:  ?Past Surgical History:  ?Procedure Laterality Date  ? CESAREAN SECTION    ? ?Medications:  ?Medications Prior to Admission  ?Medication Sig Dispense Refill Last Dose  ? Prenatal Vit-Fe Fumarate-FA (PRENATAL MULTIVITAMIN) TABS tablet Take 1 tablet by mouth daily at 12 noon.   01/10/2022  ? ? ? ?Allergies: has No Known Allergies. ?OBHx:  ?OB History  ?Gravida Para Term Preterm AB Living  ?5 3 3   1 3   ?SAB IAB Ectopic Multiple Live Births  ?1       3  ?  ?# Outcome Date GA Lbr Len/2nd Weight Sex Delivery Anes PTL Lv  ?5 Current           ?4 SAB 05/2015 [redacted]w[redacted]d    SAB     ?3 Term 09/23/09 [redacted]w[redacted]d   M CS-Unspec   LIV  ?2 Term 10/25/08    M CS-Unspec   LIV  ?1 Term 01/29/97 [redacted]w[redacted]d   M CS-Unspec   LIV  ? ?    ?FHx:  ?Family History  ?Problem Relation Age of Onset  ? Leukemia Father   ? ?Soc Hx:  ?Social History  ? ?Socioeconomic History  ? Marital status: Single  ?  Spouse name: Not on file  ? Number of children: Not on file  ? Years of education: Not on file  ? Highest education level: Not on file   ?Occupational History  ? Not on file  ?Tobacco Use  ? Smoking status: Never  ? Smokeless tobacco: Never  ?Vaping Use  ? Vaping Use: Never used  ?Substance and Sexual Activity  ? Alcohol use: Never  ? Drug use: Never  ? Sexual activity: Not on file  ?Other Topics Concern  ? Not on file  ?Social History Narrative  ? Not on file  ? ?Social Determinants of Health  ? ?Financial Resource Strain: Not on file  ?Food Insecurity: Not on file  ?Transportation Needs: Not on file  ?Physical Activity: Not on file  ?Stress: Not on file  ?Social Connections: Not on file  ?Intimate Partner Violence: Not on file  ? ? ?Objective  ? ? Current Vital Signs 24h Vital Sign Ranges  ?T 97.8 ?F (36.6 ?C) Temp  Avg: 97.8 ?F (36.6 ?C)  Min: 97.8 ?F (36.6 ?C)  Max: 97.8 ?F (36.6 ?C)  ?BP (!) 111/54 ? BP  Min: 111/54  Max: 111/54  ?HR 76 Pulse  Avg: 76  Min: 76  Max: 76  ?RR 18 Resp  Avg: 18  Min: 18  Max:  18  ?SaO2 98 % Room Air SpO2  Avg: 98 %  Min: 98 %  Max: 98 %  ?    ? 24 Hour I/O Current Shift I/O  ?Time ?Ins ?Outs No intake/output data recorded. No intake/output data recorded.  ? ?FHTs: pending  ? ?General: Well nourished, well developed female in no acute distress.  ?Skin:  Warm and dry.  ?Cardiovascular: S1, S2 normal, no murmur, rub or gallop, regular rate and rhythm ?Respiratory:  Clear to auscultation bilateral. Normal respiratory effort ?Abdomen: gravid, nttp. Well healed vertical midline and low transverse skin incision with the latter barely visible ?Neuro/Psych:  Normal mood and affect.  ? ?Labs  ?O POS ? ?Recent Labs  ?Lab 01/10/22 ?0937  ?WBC 9.3  ?HGB 11.7*  ?HCT 35.1*  ?PLT 163  ? ?Negative hiv ? ?Pending: covid, rprp ? ?Radiology ?3/1: cephalic, anterior placenta, bpp 8/8, efw 41% ? ?Assessment & Plan  ? 41 y.o. [id number] @ [redacted]w[redacted]d here for scheduled rpt c-section. Pt doin gwell ?D/w her re: risks of repeat c-section and she amenable with proceeding. She would like to do a BTL. I d/w her re: risks and benefits of the procedure,  namely no change in periods and to consider the procedure permanent and she would like to proceed.  ?Patient consented for rpt c-section and BTL. Will use low transverse skin incision. No prior op notes available so will do CSE or at least leave the catheter in place ? ?O pos, antibody negative, hepB neg/hiv neg 12-1/rubella immune/rpr pending/hepC neg/pap neg 2021/GBS neg/tdap UTD/Low risk cffdna ? ?Durene Romans MD ?Attending ?[facility] [employer] [facility] farm manager) ?GYN Consult Phone: [phone] (M-F, [phone]) & [phone] (Off hours, weekends, holidays) ? ? ?

## 2022-01-11 NOTE — Discharge Summary (Signed)
? ?  Postpartum Discharge Summary ? ?   ?Patient Name: Andrea Day ?DOB: May 06, 1981 ?MRN: [id number] ?OB Provider: [facility] Department ? ?Date of admission: 01/11/2022 ?Delivery date:01/11/2022  ?Delivering provider: Aletha Halim  ?Date of discharge: 01/13/2022 ? ?Admitting diagnosis: Pregnancy at 52/2. History of c-section x 3. Desire for permanent sterilization. ? ?Additional problems: AMA. BMI 40s. History of chlamydia with negative test of cure.   ?Discharge diagnosis: Term Pregnancy Delivered                                              ?Post partum procedures: Postpartum tubal ligation ?Complications: None ? ?Hospital course: Patient had a scheduled, repeat LTCS with BTL (bilateral salpingectomies) on the day of admission, which was uncomplicated.  ?Membrane Rupture Time/Date: 9:45 AM ,01/11/2022   ?Delivery Method:C-Section, Low Transverse  ?Details of operation can be found in separate operative note.  Patient had an uncomplicated postpartum course.  She is ambulating, tolerating a regular diet, passing flatus, and urinating well.  Patient is discharged home in stable condition on  01/13/22 ?       ?Newborn Data: ?Birth date:01/11/2022  ?Birth time:9:45 AM  ?Gender:Female  ?Living status:Living  ?Apgars:8 ,9  ?Weight:3440 g    ? ?Magnesium Sulfate received: No ?BMZ received: No ?Rhophylac: N/A ?MMR: N/A ?T-DaP: Given prenatally ?Transfusion: No ? ?Physical exam  ?Vitals:  ? 01/12/22 1649 01/12/22 2100 01/12/22 2159 01/13/22 7829  ?BP: 105/66 (!) 92/49 (!) 104/54 (!) 106/54  ?Pulse: 67 75 78 72  ?Resp: 16 16 17 18   ?Temp: 98 ?F (36.7 ?C) 98.2 ?F (36.8 ?C) 97.9 ?F (36.6 ?C) 98.1 ?F (36.7 ?C)  ?TempSrc: Oral Oral Oral Oral  ?SpO2: 100% 96%  98%  ?Weight:      ?Height:      ? ?General: alert, cooperative, and no distress ?Lochia: appropriate ?Uterine Fundus: firm and below umbilicus  ?Incision: healing well with no significant drainage, no significant erythema, dressing is clean, dry, and  intact ?DVT Evaluation: no LE edema or calf tenderness to palpation  ? ?Labs: ?CBC Latest Ref Rng & Units 01/12/2022 01/10/2022  ?WBC 4.0 - 10.5 K/uL 12.6(H) 9.3  ?Hemoglobin 12.0 - 15.0 g/dL 10.0(L) 11.7(L)  ?Hematocrit 36.0 - 46.0 % 29.4(L) 35.1(L)  ?Platelets 150 - 400 K/uL 142(L) 163  ? ? ?CMP Latest Ref Rng & Units 01/12/2022  ?Creatinine 0.44 - 1.00 mg/dL 0.59  ? ?Edinburgh Score: ?Edinburgh Postnatal Depression Scale Screening Tool 01/11/2022  ?I have been able to laugh and see the funny side of things. 0  ?I have looked forward with enjoyment to things. 0  ?I have blamed myself unnecessarily when things went wrong. 1  ?I have been anxious or worried for no good reason. 1  ?I have felt scared or panicky for no good reason. 1  ?Things have been getting on top of me. 2  ?I have been so unhappy that I have had difficulty sleeping. 0  ?I have felt sad or miserable. 1  ?I have been so unhappy that I have been crying. 1  ?The thought of harming myself has occurred to me. 0  ?Edinburgh Postnatal Depression Scale Total 7  ? ? ? ? ?After visit meds:  ?Allergies as of 01/13/2022   ?No Known Allergies ?  ? ?  ?Medication List  ?  ? ?TAKE these medications   ? ?  acetaminophen 500 MG tablet ?Commonly known as: TYLENOL ?Take 2 tablets (1,000 mg total) by mouth every 8 (eight) hours as needed (pain). ?  ?ibuprofen 600 MG tablet ?Commonly known as: ADVIL ?Take 1 tablet (600 mg total) by mouth every 6 (six) hours as needed (pain). ?  ?oxyCODONE 5 MG immediate release tablet ?Commonly known as: Oxy IR/ROXICODONE ?Take 1 tablet (5 mg total) by mouth every 6 (six) hours as needed for severe pain or breakthrough pain. ?  ?prenatal multivitamin Tabs tablet ?Take 1 tablet by mouth daily at 12 noon. ?  ? ?  ? ?  ?  ? ? ?  ?Discharge Care Instructions  ?(From admission, onward)  ?  ? ? ?  ? ?  Start     Ordered  ? 01/13/22 0000  Discharge wound care:       ?Comments: Remove dressing 5 days after your surgery date. You can then wash gently with  soap and water in the shower and pat day. You will have an incision check in the office in about 1 week.  ? 01/13/22 0732  ? ?  ?  ? ?  ? ? ? ?Discharge home in stable condition ?Infant Feeding: Breast ?Infant Disposition: home with mother ?Discharge instruction: per After Visit Summary and Postpartum booklet. ?Activity: Advance as tolerated. Pelvic rest for 6 weeks.  ?Diet: routine diet ?Anticipated Birth Control:  BTL done with c-section ?Postpartum Appointment: Incision check in one week, postpartum visit in 4-6 weeks (at [facility]) ? ?Future Appointments: ?Future Appointments  ?Date Time Provider [place]  ?01/18/2022  1:30 PM [facility] NURSE [facility] [place]  ? ?01/13/2022 ?Genia Del, MD ? ? ?

## 2022-01-11 NOTE — Anesthesia Procedure Notes (Signed)
Spinal ? ?Patient location during procedure: OB ?End time: 01/11/2022 9:15 AM ?Reason for block: surgical anesthesia ?Staffing ?Performed: anesthesiologist  ?Anesthesiologist: Lynda Rainwater, MD ?Preanesthetic Checklist ?Completed: patient identified, IV checked, risks and benefits discussed, surgical consent, monitors and equipment checked, pre-op evaluation and timeout performed ?Spinal Block ?Patient position: sitting ?Prep: DuraPrep and site prepped and draped ?Patient monitoring: heart rate, cardiac monitor, continuous pulse ox and blood pressure ?Approach: midline ?Location: L3-4 ?Injection technique: single-shot ?Needle ?Needle type: Pencan and Tuohy  ?Needle gauge: 24 G ?Needle length: 10 cm ?Assessment ?Sensory level: T4 ?Events: CSF return ?Additional Notes ?CSE performed at surgeon request ? ? ? ?

## 2022-01-11 NOTE — Transfer of Care (Signed)
Immediate Anesthesia Transfer of Care Note ? ?Patient: Andrea Day ? ?Procedure(s) Performed: CESAREAN SECTION ?BILATERAL TUBAL LIGATION ? ?Patient Location: PACU ? ?Anesthesia Type:Spinal ? ?Level of Consciousness: awake, alert , oriented and patient cooperative ? ?Airway & Oxygen Therapy: Patient Spontanous Breathing ? ?Post-op Assessment: Report given to RN and Post -op Vital signs reviewed and stable ? ?Post vital signs: Reviewed and stable ? ?Last Vitals:  ?Vitals Value Taken Time  ?BP 88/50 01/11/22 1143  ?Temp 36.6 ?C 01/11/22 1143  ?Pulse 58 01/11/22 1143  ?Resp 14 01/11/22 1133  ?SpO2 97 % 01/11/22 1143  ?Vitals shown include unvalidated device data. ? ?Last Pain:  ?Vitals:  ? 01/11/22 1305  ?TempSrc: Oral  ?PainSc: 0-No pain  ?   ? ?  ? ?Complications: No notable events documented. ?

## 2022-01-11 NOTE — Anesthesia Postprocedure Evaluation (Signed)
Anesthesia Post Note ? ?Patient: Andrea Day ? ?Procedure(s) Performed: CESAREAN SECTION ?BILATERAL TUBAL LIGATION ? ?  ? ?Patient location during evaluation: PACU ?Anesthesia Type: Spinal ?Level of consciousness: awake and alert ?Pain management: pain level controlled ?Vital Signs Assessment: post-procedure vital signs reviewed and stable ?Respiratory status: spontaneous breathing, nonlabored ventilation and respiratory function stable ?Cardiovascular status: blood pressure returned to baseline and stable ?Postop Assessment: no apparent nausea or vomiting ?Anesthetic complications: no ? ? ?No notable events documented. ? ?Last Vitals:  ?Vitals:  ? 01/11/22 1143 01/11/22 1305  ?BP: (!) 88/50 (!) 120/57  ?Pulse: (!) 58 (!) 58  ?Resp:  18  ?Temp: 36.6 ?C 36.5 ?C  ?SpO2: 97% 98%  ?  ?Last Pain:  ?Vitals:  ? 01/11/22 1305  ?TempSrc: Oral  ?PainSc: 0-No pain  ? ?Pain Goal:   ? ?  ?  ?  ?  ?  ?  ?  ? ?Lowella Curb ? ? ? ? ?

## 2022-01-11 NOTE — Anesthesia Preprocedure Evaluation (Signed)
Anesthesia Evaluation  ?Patient identified by MRN, date of birth, ID band ?Patient awake ? ? ? ?Reviewed: ?Allergy & Precautions, NPO status , Patient's Chart, lab work & pertinent test results ? ?Airway ?Mallampati: II ? ?TM Distance: >3 FB ?Neck ROM: Full ? ? ? Dental ?no notable dental hx. ? ?  ?Pulmonary ?neg pulmonary ROS,  ?  ?Pulmonary exam normal ?breath sounds clear to auscultation ? ? ? ? ? ? Cardiovascular ?negative cardio ROS ?Normal cardiovascular exam ?Rhythm:Regular Rate:Normal ? ? ?  ?Neuro/Psych ?negative neurological ROS ? negative psych ROS  ? GI/Hepatic ?negative GI ROS, Neg liver ROS,   ?Endo/Other  ?negative endocrine ROS ? Renal/GU ?negative Renal ROS  ?negative genitourinary ?  ?Musculoskeletal ?negative musculoskeletal ROS ?(+)  ? Abdominal ?(+) + obese,   ?Peds ?negative pediatric ROS ?(+)  Hematology ?negative hematology ROS ?(+)   ?Anesthesia Other Findings ? ? Reproductive/Obstetrics ?(+) Pregnancy ? ?  ? ? ? ? ? ? ? ? ? ? ? ? ? ?  ?  ? ? ? ? ? ? ? ? ?Anesthesia Physical ?Anesthesia Plan ? ?ASA: 3 ? ?Anesthesia Plan: Spinal  ? ?Post-op Pain Management: Minimal or no pain anticipated and Regional block*  ? ?Induction: Intravenous ? ?PONV Risk Score and Plan: 2 and Treatment may vary due to age or medical condition ? ?Airway Management Planned: Natural Airway ? ?Additional Equipment:  ? ?Intra-op Plan:  ? ?Post-operative Plan:  ? ?Informed Consent: I have reviewed the patients History and Physical, chart, labs and discussed the procedure including the risks, benefits and alternatives for the proposed anesthesia with the patient or authorized representative who has indicated his/her understanding and acceptance.  ? ? ? ?Dental advisory given ? ?Plan Discussed with: CRNA ? ?Anesthesia Plan Comments:   ? ? ? ? ? ? ?Anesthesia Quick Evaluation ? ?

## 2022-01-11 NOTE — Lactation Note (Signed)
This note was copied from a baby's chart. ?Lactation Consultation Note ? ?Patient Name: Andrea Day ?Today's Date: 01/11/2022 ?Reason for consult: Early term 89-38.6wks;Initial assessment;Term ?Age:40 hours ? ?Visited with mom of 3 hours old FT female, she's a P4 and reported moderate breast changes during this pregnancy. Mom is experienced BF, she BF all her other kids her last baby at home is now 47 y.o. LC assisted with hand expression, able to get some droplets of colostrum (first try got rusty pipe but then it was clear); rubbed colostrum in baby's mouth. ? ?Reviewed normal newborn behavior, feeding cues, size of baby's stomach, cluster feeding and lactogenesis II. ? ?Maternal Data ?Has patient been taught Hand Expression?: Yes ?Does the patient have breastfeeding experience prior to this delivery?: Yes ?How long did the patient breastfeed?: # 1 for 3 years and 8 months, # 2 for 3 months (pump and bottle feed due to cleft palate) and last baby for 4 years ? ?Feeding ?Mother's Current Feeding Choice: Breast Milk ? ?Interventions ?Interventions: Breast feeding basics reviewed;Breast massage;Hand express;Education;LC Services brochure ? ?Plan of care ?Encouraged mom to put baby to breast STS 8-12 times/24 hours or sooner if feeding cues are present ?Breast massage, hand expression and finger/spoon feeding were also encouraged prior latching ? ?No other support person at this time. All questions and concerns answered, mom to call [facility] services PRN. ? ?Consult Status ?Consult Status: Follow-up ?Date: 01/12/22 ?Follow-up type: In-patient ? ? ?Kaari Zeigler S Ranald Alessio ?01/11/2022, 1:29 PM ? ? ? ?

## 2022-01-12 LAB — CBC
HCT: 29.4 % — ABNORMAL LOW (ref 36.0–46.0)
Hemoglobin: 10 g/dL — ABNORMAL LOW (ref 12.0–15.0)
MCH: 31.1 pg (ref 26.0–34.0)
MCHC: 34 g/dL (ref 30.0–36.0)
MCV: 91.3 fL (ref 80.0–100.0)
Platelets: 142 10*3/uL — ABNORMAL LOW (ref 150–400)
RBC: 3.22 MIL/uL — ABNORMAL LOW (ref 3.87–5.11)
RDW: 14 % (ref 11.5–15.5)
WBC: 12.6 10*3/uL — ABNORMAL HIGH (ref 4.0–10.5)
nRBC: 0 % (ref 0.0–0.2)

## 2022-01-12 LAB — CREATININE, SERUM
Creatinine, Ser: 0.59 mg/dL (ref 0.44–1.00)
GFR, Estimated: >60 mL/min (ref >60)

## 2022-01-12 LAB — SURGICAL PATHOLOGY

## 2022-01-12 NOTE — Progress Notes (Signed)
Subjective: ?Postpartum Day 1: Cesarean Delivery ?Patient reports incisional pain, tolerating PO, + flatus, and no problems voiding.   ? ?Objective: ?Vital signs in last 24 hours: ?Temp:  [98.1 ?F (36.7 ?C)-99 ?F (37.2 ?C)] 98.1 ?F (36.7 ?C) (03/08 0530) ?Pulse Rate:  [56-60] 58 (03/08 0530) ?Resp:  [16-18] 16 (03/08 0530) ?BP: (87-95)/(51-59) 87/51 (03/08 0530) ?SpO2:  [96 %-100 %] 100 % (03/08 0530) ? ?Physical Exam:  ?General: alert, cooperative, and no distress ?Lochia: appropriate ?Uterine Fundus: firm ?Incision: no significant drainage ?DVT Evaluation: No evidence of DVT seen on physical exam. ? ?Recent Labs  ?  01/10/22 ?3382 01/12/22 ?5053  ?HGB 11.7* 10.0*  ?HCT 35.1* 29.4*  ? ? ?Assessment/Plan: ?Status post Cesarean section. Doing well postoperatively.  ?Continue current care. ? ?Sharen Counter ?01/12/2022, 3:43 PM ? ? ?
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
## 2022-01-13 MED ORDER — ACETAMINOPHEN 500 MG PO TABS
1000.0000 mg | ORAL_TABLET | Freq: Three times a day (TID) | ORAL | 0 refills | Status: AC | PRN
Start: 2022-01-13 — End: ?

## 2022-01-13 MED ORDER — OXYCODONE HCL 5 MG PO TABS: 5.0000 mg | ORAL_TABLET | Freq: Four times a day (QID) | ORAL | 0 refills | Status: AC | PRN

## 2022-01-13 MED ORDER — IBUPROFEN 600 MG PO TABS: 600.0000 mg | ORAL_TABLET | Freq: Four times a day (QID) | ORAL | 0 refills | Status: AC | PRN

## 2022-01-13 NOTE — Lactation Note (Signed)
This note was copied from a baby's chart. ?Lactation Consultation Note ? ?Patient Name: Andrea Day ?Today's Date: 01/13/2022 ?Reason for consult: Follow-up assessment;Infant weight loss;Breastfeeding assistance;Nipple pain/trauma;Term;Other (Comment) (9 % weight loss,/ [employer] MBURN translated ( Spanish for [facility]) ?Age:40 hours ?LC offered to assess and assist with  feeding and mom receptive,  ?LC stressed the importance of STS feedings until the baby is back to birth weight and gaining steadily and can stay awake for majority of the feeding.  ?Baby awake , rooting and latched easily on the left breast and fed 20 mins with depth. Initially mom having some discomfort for less than 1 minute and per mom comfortable.  ?LC recommended prior to latching on the 1st breast , reverse pressure ( showed mom ) to elongate the nipple / areola complex for a deeper latch. Mom aware to add pre pumping if needed when breast are over full.  ?Nipple well rounded when baby released and was satisfied.  ?Mom has a hand pump with #24 F and #27 F .  ?LC reviewed BF D/C teaching .  ?LC communicated results of the [facility] consult with the NP .  ? ?Maternal Data ?Has patient been taught Hand Expression?: Yes ? ?Feeding ?Mother's Current Feeding Choice: Breast Milk ? ?LATCH Score ?Latch: Grasps breast easily, tongue down, lips flanged, rhythmical sucking. ? ?Audible Swallowing: Spontaneous and intermittent ? ?Type of Nipple: Everted at rest and after stimulation ? ?Comfort (Breast/Nipple): Filling, red/small blisters or bruises, mild/mod discomfort ? ?Hold (Positioning): Assistance needed to correctly position infant at breast and maintain latch. ? ?LATCH Score: 8 ? ? ?Lactation Tools Discussed/Used ?Tools: Pump;Flanges ?Flange Size: 24;27 ?Breast pump type: Manual ?Pump Education: Milk Storage;Setup, frequency, and cleaning;Other (comment) (reviewed set up) ? ?Interventions ?Interventions: Breast feeding basics reviewed;Assisted  with latch;Skin to skin;Breast massage;Hand express;Reverse pressure;Breast compression;Adjust position;Support pillows;Position options;Hand pump;Education;LC Services brochure ? ?Discharge ?Discharge Education: Engorgement and breast care;Warning signs for feeding baby ?Pump: Manual ?WIC Program: Yes ? ?Consult Status ?Consult Status: Complete ?Date: 01/13/22 ? ? ? ?Matilde Sprang Delfino Friesen ?01/13/2022, 10:29 AM ? ? ? ?

## 2022-01-13 NOTE — Lactation Note (Signed)
This note was copied from a baby's chart. ?Lactation Consultation Note ? ?Patient Name: Andrea Day ?Today's Date: 01/13/2022 ?Reason for consult: Mother's request;Follow-up assessment;Term (weight loss of -7%) ?Age:40 hours, term female infant. ?Per mom, she has not been latching infant at both breast during a feeding, Matthews worked with mom to do breast stimulation to keep infant awake while feeding and not allow infant just to hand out at the breast and not actively breastfeed. ?Mom latched infant on her left breast using the football hold position and mom did breastfeeding stimulation techniques to keep infant awake, infant latched with depth, swallows heard, infant BF for 15 minutes. ?Mom was given hand pump to pump afterwards and give infant back any EBM to help stabilize weight, mom easily pumped 7 mls of colostrum that was spoon fed to infant.  ?Afterwards infant was cuing again to BF, mom latched infant on her right breast using cradle hold position, LC did not help with latch and infant was still breastfeeding when [facility] left the room. ?Mom's current feeding plan: ?1- Mom will continue to breastfeed infant according to hunger cues, 8 to 12+ or more times within 24 hours, skin to skin. ?2- Mom will start to latch infant on both breast during a feeding and continue to use breast stimulation techniques to keep infant awake and actively feeding at the breast. ?3- Mom knows she can use hand pump and give infant back EBM after latching infant at the breast if she chooses.  ?Maternal Data ?  ? ?Feeding ?Mother's Current Feeding Choice: Breast Milk ? ?LATCH Score ?Latch: Grasps breast easily, tongue down, lips flanged, rhythmical sucking. ? ?Audible Swallowing: Spontaneous and intermittent ? ?Type of Nipple: Everted at rest and after stimulation ? ?Comfort (Breast/Nipple): Soft / non-tender ? ?Hold (Positioning): Assistance needed to correctly position infant at breast and maintain latch. ? ?LATCH Score:  9 ? ? ?Lactation Tools Discussed/Used ?Tools: Pump ?Breast pump type: Manual ?Pump Education: Setup, frequency, and cleaning;Milk Storage ?Reason for Pumping: Mom using hand pump and will give infant back any EBM she pumped after latching infant at breast, infant with weight loss of -7% within first 24 hours of life. ?Pumped volume: 7 mL ? ?Interventions ?Interventions: Assisted with latch;Skin to skin;Breast compression;Adjust position;Support pillows;Position options;Expressed milk;Hand pump;Education;LC Services brochure ? ?Discharge ?  ? ?Consult Status ?Consult Status: Follow-up ?Date: 01/13/22 ?Follow-up type: In-patient ? ? ? ?Vicente Serene ?01/13/2022, 1:42 AM ? ? ? ?

## 2022-01-18 ENCOUNTER — Ambulatory Visit (INDEPENDENT_AMBULATORY_CARE_PROVIDER_SITE_OTHER): Payer: Self-pay

## 2022-01-18 ENCOUNTER — Other Ambulatory Visit: Payer: Self-pay

## 2022-01-18 VITALS — BP 110/57 | HR 75 | Wt 209.1 lb

## 2022-01-18 DIAGNOSIS — Z5189 Encounter for other specified aftercare: Secondary | ICD-10-CM

## 2022-01-18 NOTE — Progress Notes (Signed)
Incision Check Visit ? ?[facility] is here for incision check following repeat c-section on 01/11/22. Soiled steri strips removed. Incision is clean and dry. Single, small area of pink, healing tissue at middle of incision. Otherwise, edges are fully approximated. Pain is well managed. Reviewed good wound care and s/s of infection. ? ?Will follow up at [facility] appt with [facility] Department. I called to schedule PP appt; office staff states April schedule is not open but they will notify nurse of need for appt. Delivery date and patient information reviewed. Patient instructed to call for appt if she is not contacted by April 1. ?   ?Encounter completed with [employer] virtual interpreter Alvie Heidelberg ID [phone]. ? ?Patient checked out by RN and escorted to [employer] prior to leaving. ? ?Annabell Howells, RN ?01/18/2022  1:32 PM ? ?

## 2022-01-18 NOTE — Patient Instructions (Signed)
[facility]   [phone]  

## 2022-03-08 ENCOUNTER — Telehealth: Payer: Self-pay

## 2022-03-08 NOTE — Telephone Encounter (Signed)
Telephoned patient and patient will call back when she stops nursing. Natale Lay (interprerter) called patient per [facility].  ?

## 2022-04-21 ENCOUNTER — Other Ambulatory Visit: Payer: Self-pay

## 2022-04-21 ENCOUNTER — Emergency Department (HOSPITAL_COMMUNITY)
Admission: EM | Admit: 2022-04-21 | Discharge: 2022-04-22 | Disposition: A | Discharge status: Home / Self Care | Payer: Self-pay | Attending: Emergency Medicine | Admitting: Emergency Medicine

## 2022-04-21 ENCOUNTER — Encounter (HOSPITAL_COMMUNITY): Payer: Self-pay

## 2022-04-21 ENCOUNTER — Emergency Department (HOSPITAL_COMMUNITY): Payer: Self-pay

## 2022-04-21 DIAGNOSIS — K8020 Calculus of gallbladder without cholecystitis without obstruction: Secondary | ICD-10-CM | POA: Insufficient documentation

## 2022-04-21 DIAGNOSIS — K2900 Acute gastritis without bleeding: Secondary | ICD-10-CM | POA: Insufficient documentation

## 2022-04-21 DIAGNOSIS — N9489 Other specified conditions associated with female genital organs and menstrual cycle: Secondary | ICD-10-CM | POA: Insufficient documentation

## 2022-04-21 DIAGNOSIS — R112 Nausea with vomiting, unspecified: Secondary | ICD-10-CM

## 2022-04-21 DIAGNOSIS — R000 Tachycardia, unspecified: Secondary | ICD-10-CM | POA: Insufficient documentation

## 2022-04-21 DIAGNOSIS — E861 Hypovolemia: Secondary | ICD-10-CM | POA: Insufficient documentation

## 2022-04-21 LAB — CBC WITH DIFFERENTIAL/PLATELET
Abs Immature Granulocytes: 0.07 10*3/uL (ref 0.00–0.07)
Basophils Absolute: 0 10*3/uL (ref 0.0–0.1)
Basophils Relative: 0 %
Eosinophils Absolute: 0.1 10*3/uL (ref 0.0–0.5)
Eosinophils Relative: 1 %
HCT: 35.1 % — ABNORMAL LOW (ref 36.0–46.0)
Hemoglobin: 11.9 g/dL — ABNORMAL LOW (ref 12.0–15.0)
Immature Granulocytes: 1 %
Lymphocytes Relative: 15 %
Lymphs Abs: 1.9 10*3/uL (ref 0.7–4.0)
MCH: 30.4 pg (ref 26.0–34.0)
MCHC: 33.9 g/dL (ref 30.0–36.0)
MCV: 89.8 fL (ref 80.0–100.0)
Monocytes Absolute: 0.9 10*3/uL (ref 0.1–1.0)
Monocytes Relative: 7 %
Neutro Abs: 9.9 10*3/uL — ABNORMAL HIGH (ref 1.7–7.7)
Neutrophils Relative %: 76 %
Platelets: 165 10*3/uL (ref 150–400)
RBC: 3.91 MIL/uL (ref 3.87–5.11)
RDW: 13.4 % (ref 11.5–15.5)
WBC: 12.9 10*3/uL — ABNORMAL HIGH (ref 4.0–10.5)
nRBC: 0 % (ref 0.0–0.2)

## 2022-04-21 LAB — COMPREHENSIVE METABOLIC PANEL
ALT: 92 U/L — ABNORMAL HIGH (ref 0–44)
AST: 117 U/L — ABNORMAL HIGH (ref 15–41)
Albumin: 3.4 g/dL — ABNORMAL LOW (ref 3.5–5.0)
Alkaline Phosphatase: 95 U/L (ref 38–126)
Anion gap: 9 (ref 5–15)
BUN: 20 mg/dL (ref 6–20)
CO2: 27 mmol/L (ref 22–32)
Calcium: 9.2 mg/dL (ref 8.9–10.3)
Chloride: 105 mmol/L (ref 98–111)
Creatinine, Ser: 1.08 mg/dL — ABNORMAL HIGH (ref 0.44–1.00)
GFR, Estimated: >60 mL/min (ref >60)
Glucose, Bld: 111 mg/dL — ABNORMAL HIGH (ref 70–99)
Potassium: 4.1 mmol/L (ref 3.5–5.1)
Sodium: 141 mmol/L (ref 135–145)
Total Bilirubin: 0.6 mg/dL (ref 0.3–1.2)
Total Protein: 6.5 g/dL (ref 6.5–8.1)

## 2022-04-21 LAB — I-STAT BETA HCG BLOOD, ED (MC, WL, AP ONLY): I-stat hCG, quantitative: <5 m[IU]/mL (ref <5)

## 2022-04-21 LAB — LACTIC ACID, PLASMA: Lactic Acid, Venous: 0.7 mmol/L (ref 0.5–1.9)

## 2022-04-21 LAB — LIPASE, BLOOD: Lipase: 41 U/L (ref 11–51)

## 2022-04-21 MED ORDER — IOHEXOL 300 MG/ML  SOLN
100.0000 mL | Freq: Once | INTRAMUSCULAR | Status: AC | PRN
  Administered 2022-04-21: 100 mL via INTRAVENOUS

## 2022-04-21 MED ORDER — METOCLOPRAMIDE HCL 5 MG/ML IJ SOLN
5.0000 mg | Freq: Once | INTRAMUSCULAR | Status: AC
  Administered 2022-04-21: 5 mg via INTRAVENOUS
  Filled 2022-04-21: qty 2

## 2022-04-21 MED ORDER — SODIUM CHLORIDE 0.9 % IV BOLUS
1000.0000 mL | Freq: Once | INTRAVENOUS | Status: AC
  Administered 2022-04-21: 1000 mL via INTRAVENOUS

## 2022-04-21 MED ORDER — LACTATED RINGERS IV BOLUS: 1000.0000 mL | Freq: Once | INTRAVENOUS | Status: DC

## 2022-04-21 MED ORDER — LACTATED RINGERS IV BOLUS
1000.0000 mL | Freq: Once | INTRAVENOUS | Status: AC
Start: 2022-04-21 — End: 2022-04-22
  Administered 2022-04-21: 1000 mL via INTRAVENOUS

## 2022-04-21 NOTE — ED Provider Notes (Signed)
[facility] EMERGENCY DEPARTMENT Provider Note   CSN: [id number] Arrival date & time: 04/21/22  1958     History  Chief Complaint  Patient presents with   Abdominal Pain    Andrea Day is a 41 y.o. female.  41 year old female who is currently 3 months s/p cesarean section and BTL presents to the ED following acute onset of nonbloody nausea, vomiting, and epigastric abdominal pain beginning earlier today.  She states that patient has significantly worsened over the last 1-2 hours.  She denies associated fevers, diarrhea, or urinary symptoms.  EMS reports that the patient became hypotensive in route, received 400cc of fluid without improvement. Glucose reassuring at 113.  The history is provided by the EMS personnel and the patient. A language interpreter was used Engineer, building services).       Home Medications Prior to Admission medications   Medication Sig Start Date End Date Taking? Authorizing Provider  alum & mag hydroxide-simeth (MAALOX MAX) 400-400-40 MG/5ML suspension Take 5 mLs by mouth every 6 (six) hours as needed for indigestion. 04/22/22  Yes Roshawn Ayala, [place], MD  metoCLOPramide (REGLAN) 10 MG tablet Take 1 tablet (10 mg total) by mouth every 6 (six) hours as needed for nausea. 04/22/22  Yes Marlayna Bannister, [place], MD  acetaminophen (TYLENOL) 500 MG tablet Take 2 tablets (1,000 mg total) by mouth every 8 (eight) hours as needed (pain). 01/13/22   Genia Del, MD  ibuprofen (ADVIL) 600 MG tablet Take 1 tablet (600 mg total) by mouth every 6 (six) hours as needed (pain). 01/13/22   Genia Del, MD  oxyCODONE (OXY IR/ROXICODONE) 5 MG immediate release tablet Take 1 tablet (5 mg total) by mouth every 6 (six) hours as needed for severe pain or breakthrough pain. 01/13/22   Genia Del, MD  Prenatal Vit-Fe Fumarate-FA (PRENATAL MULTIVITAMIN) TABS tablet Take 1 tablet by mouth daily at 12 noon.    [provider]      Allergies     Patient has no known allergies.    Review of Systems   Review of Systems  Constitutional:  Negative for chills and fever.  Respiratory:  Negative for cough and shortness of breath.   Cardiovascular:  Negative for chest pain.  Gastrointestinal:  Positive for abdominal pain, nausea and vomiting. Negative for blood in stool and diarrhea.  Genitourinary:  Negative for dysuria and hematuria.  Neurological:  Positive for dizziness.    Physical Exam Updated Vital Signs BP 111/67   Pulse (!) 53   Temp (!) 97.5 F (36.4 C) (Oral)   Resp 19   SpO2 97%  Physical Exam Vitals and nursing note reviewed.  Constitutional:      General: She is not in acute distress.    Appearance: She is well-developed. She is obese. She is not ill-appearing.  HENT:     Head: Normocephalic.  Cardiovascular:     Rate and Rhythm: Regular rhythm. Tachycardia present.     Pulses:          Radial pulses are 2+ on the right side and 2+ on the left side.     Heart sounds: No murmur heard. Pulmonary:     Effort: Pulmonary effort is normal. No respiratory distress.     Breath sounds: Normal breath sounds.  Abdominal:     Palpations: Abdomen is soft.     Tenderness: There is abdominal tenderness in the right upper quadrant and epigastric area. There is no guarding.  Musculoskeletal:  General: No swelling.     Right lower leg: No edema.     Left lower leg: No edema.  Skin:    General: Skin is warm and dry.  Neurological:     Mental Status: She is alert.     ED Results / Procedures / Treatments   Labs (all labs ordered are listed, but only abnormal results are displayed) Labs Reviewed  CBC WITH DIFFERENTIAL/PLATELET - Abnormal; Notable for the following components:      Result Value   WBC 12.9 (*)    Hemoglobin 11.9 (*)    HCT 35.1 (*)    Neutro Abs 9.9 (*)    All other components within normal limits  COMPREHENSIVE METABOLIC PANEL - Abnormal; Notable for the following components:   Glucose,  Bld 111 (*)    Creatinine, Ser 1.08 (*)    Albumin 3.4 (*)    AST 117 (*)    ALT 92 (*)    All other components within normal limits  LIPASE, BLOOD  LACTIC ACID, PLASMA  URINALYSIS, ROUTINE W REFLEX MICROSCOPIC  I-STAT BETA HCG BLOOD, ED ([facility], [facility], [facility] ONLY)    EKG None  Radiology CT ABDOMEN PELVIS W CONTRAST  Result Date: 04/21/2022 CLINICAL DATA:  Nausea/vomiting c/f pancreatitis vs cholecystitis EXAM: CT ABDOMEN AND PELVIS WITH CONTRAST TECHNIQUE: Multidetector CT imaging of the abdomen and pelvis was performed using the standard protocol following bolus administration of intravenous contrast. RADIATION DOSE REDUCTION: This exam was performed according to the departmental dose-optimization program which includes automated exposure control, adjustment of the mA and/or kV according to patient size and/or use of iterative reconstruction technique. CONTRAST:  OMNIPAQUE IOHEXOL 300 MG/ML  SOLN COMPARISON:  None Available. FINDINGS: Lower chest: No acute abnormality. Hepatobiliary: Tiny layering gallstone within the gallbladder. No biliary ductal dilatation or focal hepatic abnormality. Pancreas: No focal abnormality or ductal dilatation. No surrounding inflammation. Spleen: No focal abnormality.  Normal size. Adrenals/Urinary Tract: No adrenal abnormality. No focal renal abnormality. No stones or hydronephrosis. Urinary bladder is unremarkable. Stomach/Bowel: Normal appendix. Stomach, large and small bowel grossly unremarkable. Vascular/Lymphatic: No evidence of aneurysm or adenopathy. Reproductive: Uterus and adnexa unremarkable.  No mass. Other: No free fluid or free air. Musculoskeletal: No acute bony abnormality. IMPRESSION: Tiny layering gallstone.  No evidence of cholecystitis. No acute findings. Electronically Signed   By: Charlett Nose M.D.   On: 04/21/2022 23:09    Procedures Procedures   Medications Ordered in ED Medications  sodium chloride 0.9 % bolus 1,000 mL (0 mLs Intravenous  Stopped 04/21/22 2229)  lactated ringers bolus 1,000 mL (0 mLs Intravenous Stopped 04/22/22 0025)  metoCLOPramide (REGLAN) injection 5 mg (5 mg Intravenous Given 04/21/22 2311)  iohexol (OMNIPAQUE) 300 MG/ML solution 100 mL (100 mLs Intravenous Contrast Given 04/21/22 2258)  alum & mag hydroxide-simeth (MAALOX/MYLANTA) 200-200-20 MG/5ML suspension 15 mL (15 mLs Oral Given 04/22/22 0025)    ED Course/ Medical Decision Making/ A&P                           Medical Decision Making Amount and/or Complexity of Data Reviewed Labs: ordered. Radiology: ordered.  Risk OTC drugs. Prescription drug management.   41 year old female with a history as above presents today following recent onset of epigastric abdominal pain with associated nausea and vomiting.  On arrival, patient was hypotensive in the 80s/30s and tachycardic but was mentating well.  Afebrile in the ED.  Emergent differential includes cholecystitis, cholelithiasis, pancreatitis,  gastritis, peptic ulcer disease.  Lower suspicion for atypical ACS as EKG is nonischemic.  I reviewed the patient's labs which are overall reassuring.  CBC with some leukocytosis of 12.9, hemoglobin stable from prior.  CMP without significant electrolyte abnormalities, no evidence of renal dysfunction.  Mildly elevated AST and ALT which are nonspecific.  No hyperbilirubinemia.  Lipase is not supportive of pancreatitis.  Lactic acid is reassuring against hypoperfusion.  Pregnancy negative. Additionally obtained a CT abdomen pelvis which are reviewed.  CT with tiny gallstone without associated evidence of cholecystitis or biliary dilatation.  She was treated with 2 L of IV fluid with significant improvement in her pressures.  We also treated her with Maalox and Reglan for presumed underlying gastritis.  She was also able to tolerate p.o. in the ED without further episodes of vomiting.  I discussed these results with the patient using a [employer] interpreter.  Advised the  patient on plan for symptomatic care at home with prescription for Reglan, Maalox, and oral hydration.  I encouraged patient to establish with a primary care provider, community access number placed in the ED.  Strict ED return precautions given.  At this time, patient is stable for discharge home.  Final Clinical Impression(s) / ED Diagnoses Final diagnoses:  Nausea and vomiting, unspecified vomiting type  Calculus of gallbladder without cholecystitis without obstruction  Acute gastritis without hemorrhage, unspecified gastritis type  Hypovolemia associated with vomiting    Rx / DC Orders ED Discharge Orders          Ordered    metoCLOPramide (REGLAN) 10 MG tablet  Every 6 hours PRN        04/22/22 0023    alum & mag hydroxide-simeth (MAALOX MAX) 400-400-40 MG/5ML suspension  Every 6 hours PRN        04/22/22 0023              Karel Turpen, [place], MD 04/22/22 8811    Jacalyn Lefevre, MD 04/23/22 [phone]

## 2022-04-21 NOTE — ED Triage Notes (Signed)
Patient has hx of a C section and tubes tied 3 months ago without complication. Patient reports periumbilical pain starting a couple of hours ago with emesis. EMS reports no blood and no diarrhea per patient. Per EMS patients son is on the way. Patient reports that she is healthy otherwise (no meds, no allergies)

## 2022-04-21 NOTE — ED Notes (Signed)
Provider at bedside at this time

## 2022-04-21 NOTE — ED Notes (Signed)
Pt off unit to CT

## 2022-04-22 MED ORDER — METOCLOPRAMIDE HCL 10 MG PO TABS: 10.0000 mg | ORAL_TABLET | Freq: Four times a day (QID) | ORAL | 0 refills | Status: AC | PRN

## 2022-04-22 MED ORDER — ALUM & MAG HYDROXIDE-SIMETH 200-200-20 MG/5ML PO SUSP
15.0000 mL | Freq: Once | ORAL | Status: AC
  Administered 2022-04-22: 15 mL via ORAL
  Filled 2022-04-22: qty 30

## 2022-04-22 MED ORDER — MAALOX MAX 400-400-40 MG/5ML PO SUSP: 5.0000 mL | Freq: Four times a day (QID) | ORAL | 0 refills | Status: AC | PRN

## 2022-04-22 NOTE — ED Notes (Signed)
Reviewed d/c and follow-up instructions with pt via [employer] interpreter, pt stated understanding

## 2022-04-22 NOTE — Discharge Instructions (Addendum)
You were seen today for vomiting and abdominal pain. Your CT scan had small gallstones but did not have any signs of infection. We will give you medications for nausea and your stomach pain. If you develop any worsening of your symptoms, please return to the emergency room for evaluation.

## 2023-08-24 IMAGING — CT CT ABD-PELV W/ CM
2 of 4 series · 17 of 46 positions shown, 19 images · IV contrast (APPLIED)
Comparison: None Available.

CLINICAL DATA: Nausea/vomiting c/f pancreatitis vs cholecystitis

EXAM:
CT ABDOMEN AND PELVIS WITH CONTRAST
TECHNIQUE: Multidetector CT imaging of the abdomen and pelvis was performed
using the standard protocol following bolus administration of
intravenous contrast.

[Series 3: abd/ pelvis 5.0 i30f 2 · axial · 0.79mm/px · z∈[-426,-6]mm · 14 of 92 slices shown, 16 images]
[im 4/92  soft-tissue]
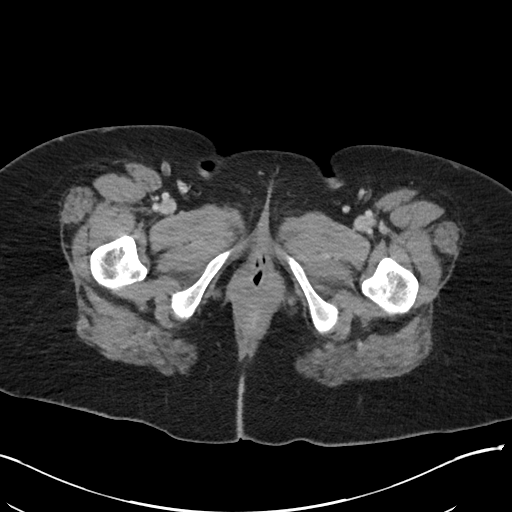
[im 4/92  bone]
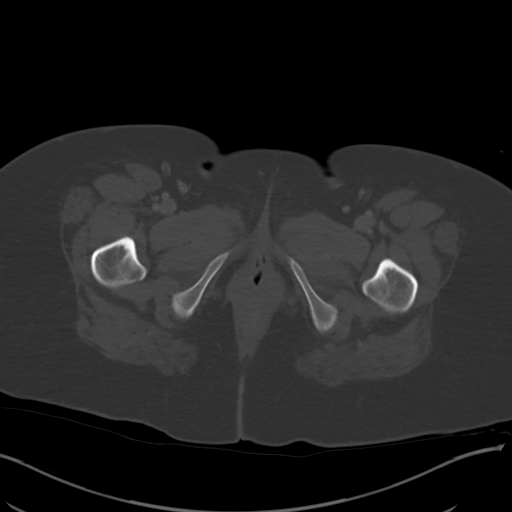
[im 12/92  soft-tissue]
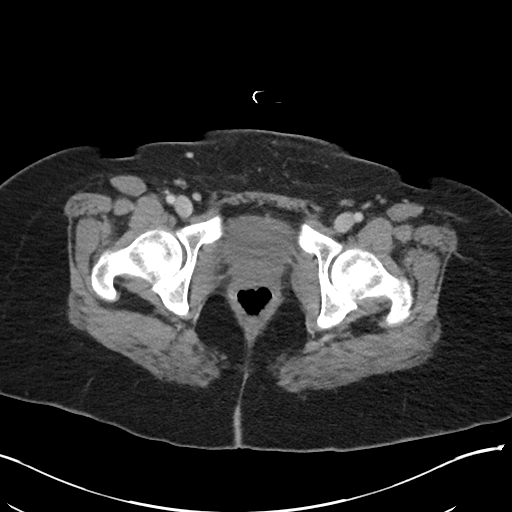
[im 19/92  soft-tissue]
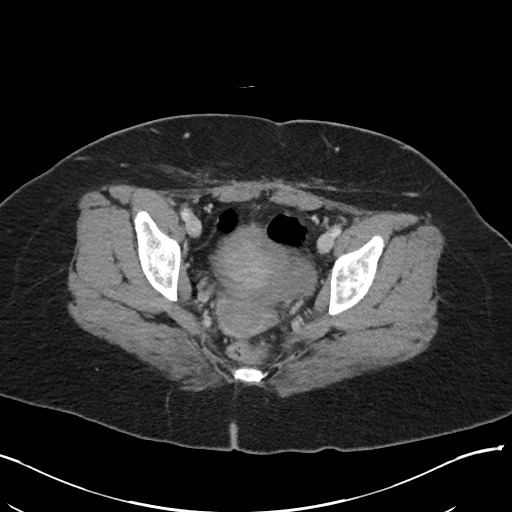
[im 23/92  soft-tissue]
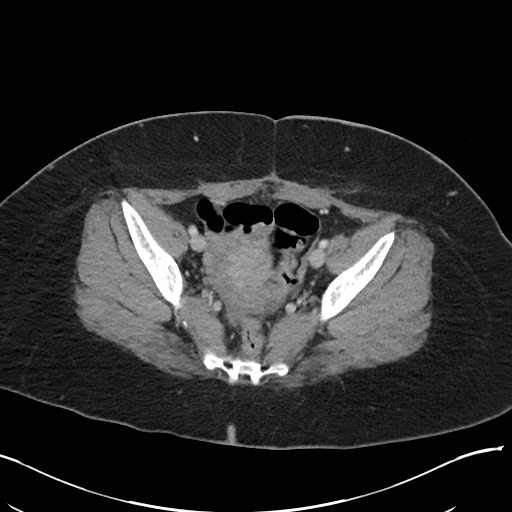
[im 31/92  soft-tissue]
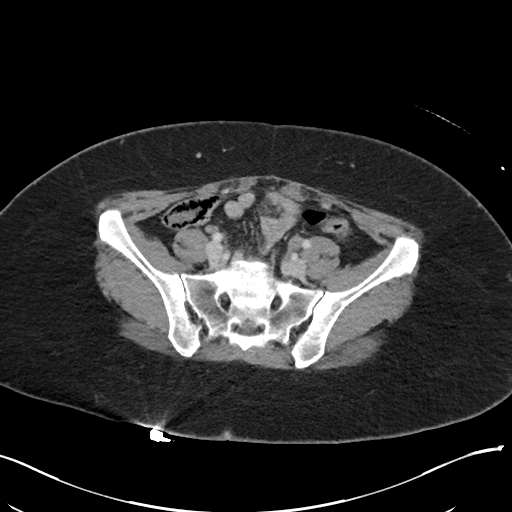
[im 38/92  soft-tissue]
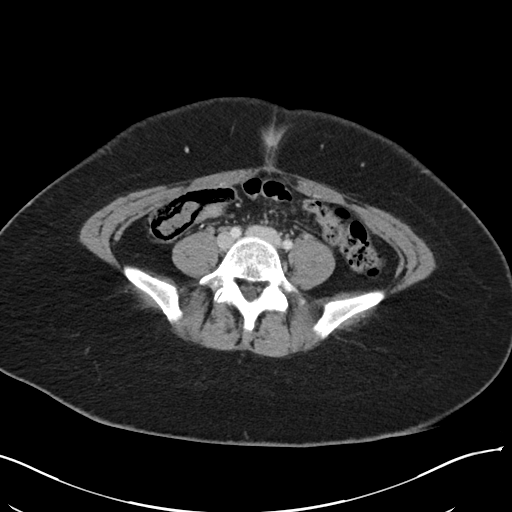
[im 42/92  soft-tissue]
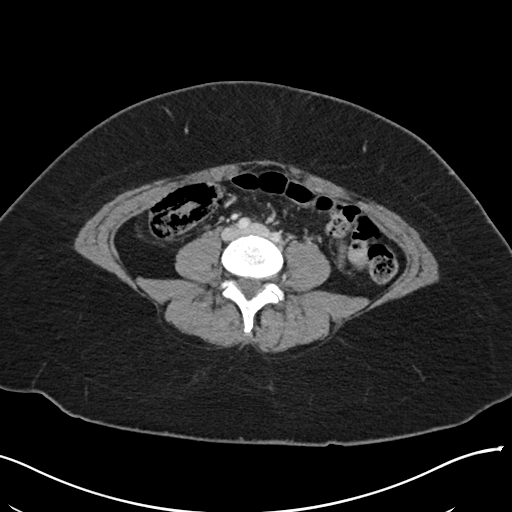
[im 50/92  soft-tissue]
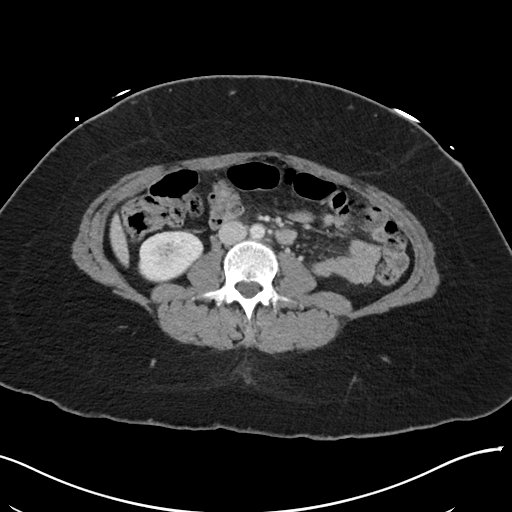
[im 54/92  soft-tissue]
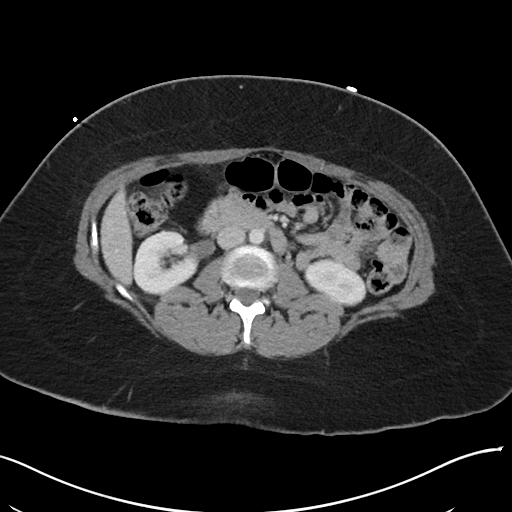
[im 54/92  bone]
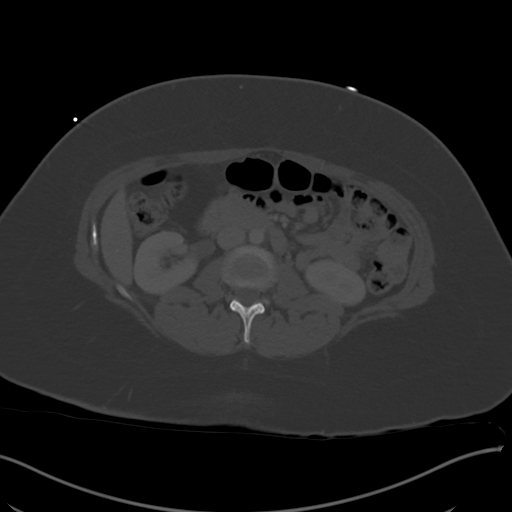
[im 61/92  soft-tissue]
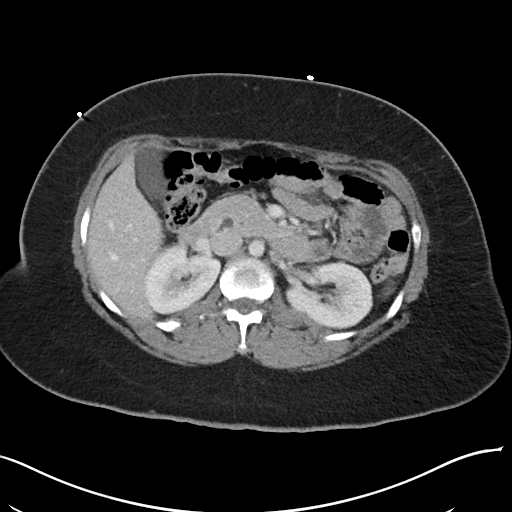
[im 69/92  soft-tissue]
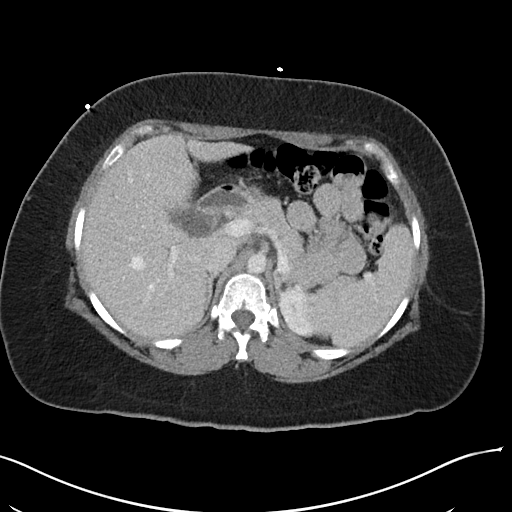
[im 73/92  soft-tissue]
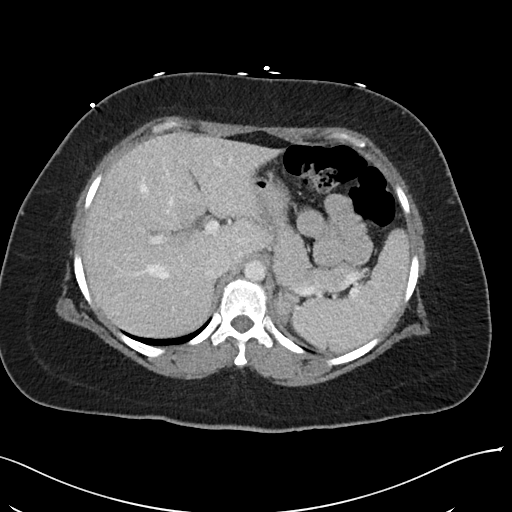
[im 80/92  soft-tissue]
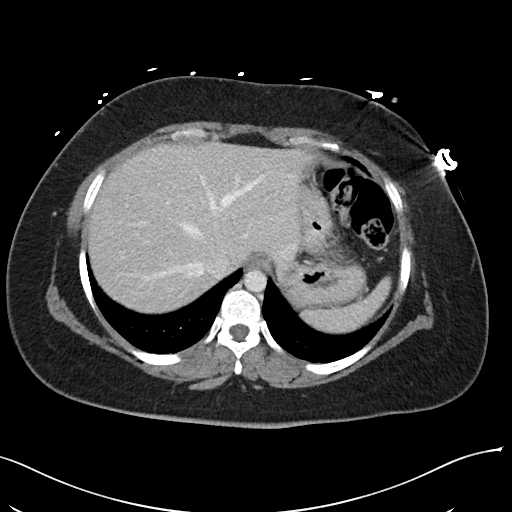
[im 88/92  soft-tissue]
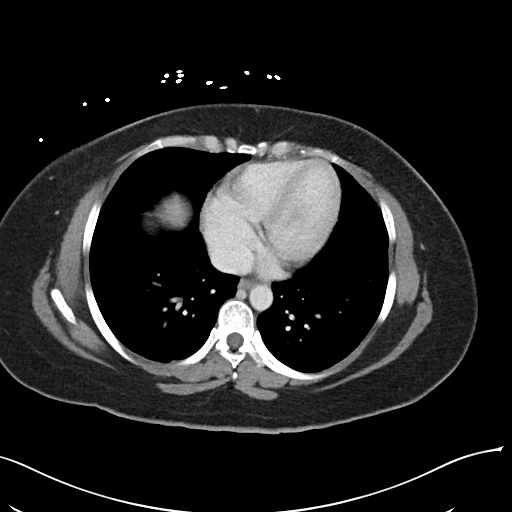

[Series 6: coronal soft tissue · coronal · 0.87mm/px · 3 of 101 slices shown]
[im 34/101  soft-tissue]
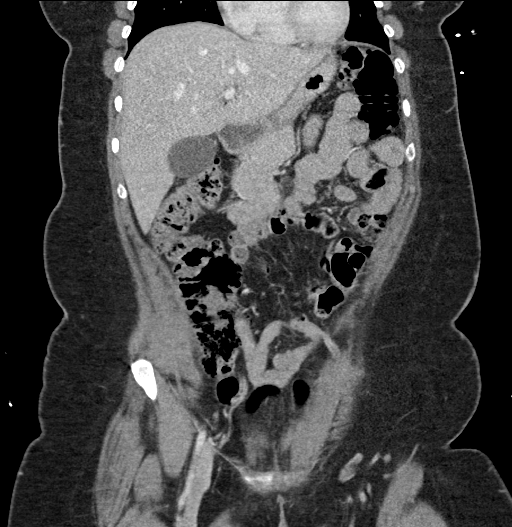
[im 45/101  soft-tissue]
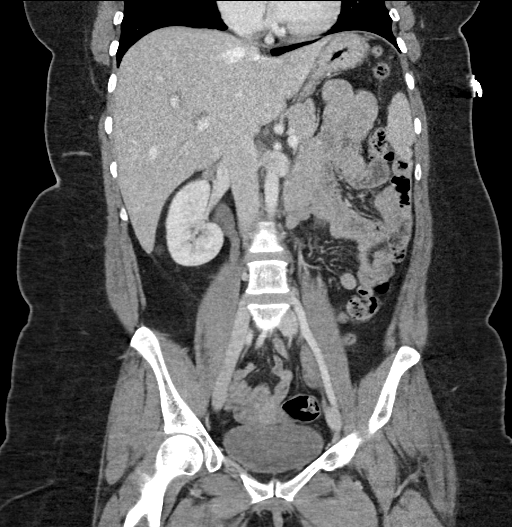
[im 56/101  soft-tissue]
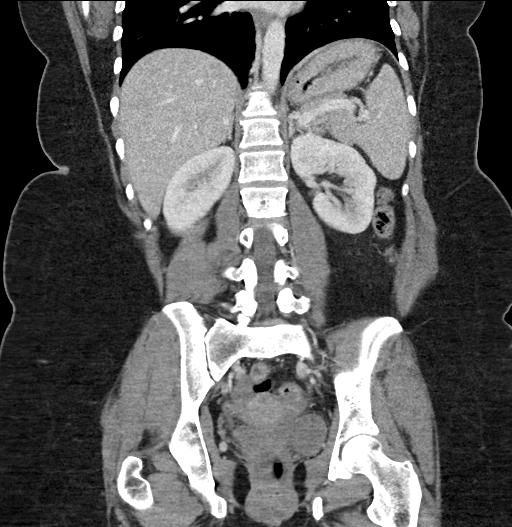

[17 of 46 positions shown; findings below may reference images not displayed]

RADIATION DOSE REDUCTION: This exam was performed according to the
departmental dose-optimization program which includes automated
exposure control, adjustment of the mA and/or kV according to
patient size and/or use of iterative reconstruction technique.

CONTRAST:  100mL OMNIPAQUE IOHEXOL 300 MG/ML  SOLN
FINDINGS: Lower chest: No acute abnormality.

Hepatobiliary: Tiny layering gallstone within the gallbladder. No
biliary ductal dilatation or focal hepatic abnormality.

Pancreas: No focal abnormality or ductal dilatation. No surrounding
inflammation.

Spleen: No focal abnormality.  Normal size.

Adrenals/Urinary Tract: No adrenal abnormality. No focal renal
abnormality. No stones or hydronephrosis. Urinary bladder is
unremarkable.

Stomach/Bowel: Normal appendix. Stomach, large and small bowel
grossly unremarkable.

Vascular/Lymphatic: No evidence of aneurysm or adenopathy.

Reproductive: Uterus and adnexa unremarkable.  No mass.

Other: No free fluid or free air.

Musculoskeletal: No acute bony abnormality.
IMPRESSION: Tiny layering gallstone.  No evidence of cholecystitis.

No acute findings.
